# Patient Record
Sex: Female | Born: 1970 | Race: Black or African American | Hispanic: No | Marital: Married | Smoking: Never smoker
Health system: Southern US, Community
[De-identification: ages and names within clinical notes are randomized; demographics above are authoritative.]

## PROBLEM LIST (undated history)

## (undated) DIAGNOSIS — Z0000 Encounter for general adult medical examination without abnormal findings: Secondary | ICD-10-CM

## (undated) HISTORY — DX: Encounter for general adult medical examination without abnormal findings: Z00.00

## (undated) HISTORY — PX: TOOTH EXTRACTION: SUR596

---

## 2017-02-16 ENCOUNTER — Ambulatory Visit (INDEPENDENT_AMBULATORY_CARE_PROVIDER_SITE_OTHER): Payer: BC Managed Care – PPO | Admitting: Osteopathic Medicine

## 2017-02-16 ENCOUNTER — Encounter: Payer: Self-pay | Admitting: Osteopathic Medicine

## 2017-02-16 VITALS — BP 111/78 | HR 82 | Ht 63.0 in | Wt 173.0 lb

## 2017-02-16 DIAGNOSIS — Z0000 Encounter for general adult medical examination without abnormal findings: Secondary | ICD-10-CM | POA: Diagnosis not present

## 2017-02-16 NOTE — Progress Notes (Signed)
HPI: Stephanie Bennett is a 46 y.o. female  who presents to [facility] today, 02/16/17,  for chief complaint of:  Chief Complaint  Patient presents with  . Establish Care    Please see below for review of preventive care.  Notes recent illness with red eye but this has resolved, no other complaints today.   Past medical, surgical, social and family history reviewed: There are no active problems to display for this patient.  History reviewed. No pertinent surgical history.   Social History  Substance Use Topics  . Smoking status: Never Smoker  . Smokeless tobacco: Never Used  . Alcohol use Not on file   History reviewed. No pertinent family history.   Current medication list and allergy/intolerance information reviewed:   Current Outpatient Prescriptions  Medication Sig Dispense Refill  . Multiple Vitamin (MULTIVITAMIN) capsule Take by mouth.    . vitamin C (ASCORBIC ACID) 500 MG tablet Take by mouth.     No current facility-administered medications for this visit.    No Known Allergies    Review of Systems:  Constitutional:  No  fever, no chills, No recent illness, No unintentional weight changes. No significant fatigue.   HEENT: No  headache, no vision change, no hearing change, No sore throat, No  sinus pressure  Cardiac: No  chest pain, No  pressure, No palpitations, No  Orthopnea  Respiratory:  No  shortness of breath. No  Cough  Gastrointestinal: No  abdominal pain, No  nausea, No  vomiting,  No  blood in stool, No  diarrhea, No  constipation   Musculoskeletal: No new myalgia/arthralgia  Genitourinary: No  incontinence, No  abnormal genital bleeding, No abnormal genital discharge  Skin: No  Rash, No other wounds/concerning lesions  Hem/Onc: No  easy bruising/bleeding, No  abnormal lymph node  Endocrine: No cold intolerance,  No heat intolerance. No polyuria/polydipsia/polyphagia   Neurologic: No  weakness, No  dizziness,  No  slurred speech/focal weakness/facial droop  Psychiatric: No  concerns with depression, No  concerns with anxiety, No sleep problems, No mood problems  Exam:  BP 111/78   Pulse 82   Ht  (1.6 m)   Wt 173 lb (78.5 kg)   BMI 30.65 kg/m   Constitutional: VS see above. General Appearance: alert, well-developed, well-nourished, NAD  Eyes: Normal lids and conjunctive, non-icteric sclera  Ears, Nose, Mouth, Throat: MMM, Normal external inspection ears/nares/mouth/lips/gums. TM normal bilaterally. Pharynx/tonsils no erythema, no exudate. Nasal mucosa normal.   Neck: No masses, trachea midline. No thyroid enlargement. No tenderness/mass appreciated. No lymphadenopathy  Respiratory: Normal respiratory effort. no wheeze, no rhonchi, no rales  Cardiovascular: S1/S2 normal, no murmur, no rub/gallop auscultated. RRR. No lower extremity edema.   Gastrointestinal: Nontender, no masses. No hepatomegaly, no splenomegaly. No hernia appreciated. Bowel sounds normal. Rectal exam deferred.   Musculoskeletal: Gait normal. No clubbing/cyanosis of digits.   Neurological: Normal balance/coordination. No tremor  Skin: warm, dry, intact. No rash/ulcer. No concerning nevi or subq nodules on limited exam.    Psychiatric: Normal judgment/insight. Normal mood and affect. Oriented x3.     ASSESSMENT/PLAN:   Annual physical exam - Plan: CBC with Differential/Platelet, COMPLETE METABOLIC PANEL WITH GFR, Lipid panel, TSH, VITAMIN D 25 Hydroxy (Vit-D Deficiency, Fractures), HIV antibody, MM DIGITAL SCREENING BILATERAL     FEMALE PREVENTIVE CARE Updated 02/16/17   ANNUAL SCREENING/COUNSELING  Diet/Exercise - HEALTHY HABITS DISCUSSED TO DECREASE CV RISK History  Smoking Status  . Never Smoker  Smokeless Tobacco  . Never Used   History  Alcohol use Not on file   Depression screen [facility] 2/9 02/19/2017  Decreased Interest 0  Down, Depressed, Hopeless 0  PHQ - 2 Score 0    Domestic violence  concerns - no  HTN SCREENING - SEE VITALS  SEXUAL HEALTH  Sexually active in the past year - Yes with female.  Need/want STI testing today? - no  Concerns about libido or pain with sex? - no  INFECTIOUS DISEASE SCREENING  HIV - needs  GC/CT - does not need  HepC - DOB [birth year] - does not need  TB - does not need  DISEASE SCREENING  Lipid - needs  DM2 - needs  Osteoporosis - women age 32+ - does not need  CANCER SCREENING  Cervical - does not need - per patietn was done recently, ew do not have records   Breast - does not need  Lung - does not need  Colon - does not need  ADULT VACCINATION  Influenza - annual vaccine recommended - she shets these   Td - booster every 10 years - need to confirm last vaccination   Zoster - option at 50, yes at 60+   PCV13 - was not indicated  PPSV23 - was not indicated  There is no immunization history on file for this patient.   OTHER  Fall - exercise and Vit D age 32+ - does not need  Consider ASA - age 72-59 - does not need    Visit summary with medication list and pertinent instructions was printed for patient to review. All questions at time of visit were answered - patient instructed to contact office with any additional concerns. ER/RTC precautions were reviewed with the patient. Follow-up plan: Return in about 1 year (around 02/16/2018) for [facility] PHYSICAL as long as labs normal and not due for Pap, sooner if needed.

## 2017-02-18 LAB — LIPID PANEL
CHOL/HDL RATIO: 4.5 ratio (ref <5.0)
CHOLESTEROL: 159 mg/dL (ref <200)
HDL: 35 mg/dL — ABNORMAL LOW (ref >50)
LDL CALC: 98 mg/dL (ref <100)
Triglycerides: 131 mg/dL (ref <150)
VLDL: 26 mg/dL (ref <30)

## 2017-02-18 LAB — CBC WITH DIFFERENTIAL/PLATELET
BASOS ABS: 39 {cells}/uL (ref 0–200)
Basophils Relative: 1 %
EOS ABS: 234 {cells}/uL (ref 15–500)
Eosinophils Relative: 6 %
HEMATOCRIT: 41.6 % (ref 35.0–45.0)
Hemoglobin: 14.2 g/dL (ref 11.7–15.5)
LYMPHS PCT: 39 %
Lymphs Abs: 1521 cells/uL (ref 850–3900)
MCH: 30.4 pg (ref 27.0–33.0)
MCHC: 34.1 g/dL (ref 32.0–36.0)
MCV: 89.1 fL (ref 80.0–100.0)
MONO ABS: 195 {cells}/uL — AB (ref 200–950)
MPV: 11 fL (ref 7.5–12.5)
Monocytes Relative: 5 %
NEUTROS PCT: 49 %
Neutro Abs: 1911 cells/uL (ref 1500–7800)
Platelets: 252 10*3/uL (ref 140–400)
RBC: 4.67 MIL/uL (ref 3.80–5.10)
RDW: 13.7 % (ref 11.0–15.0)
WBC: 3.9 10*3/uL (ref 3.8–10.8)

## 2017-02-18 LAB — COMPLETE METABOLIC PANEL WITH GFR
AG RATIO: 1.4 ratio (ref 1.0–2.5)
ALT: 16 U/L (ref 6–29)
AST: 20 U/L (ref 10–35)
Albumin: 4.2 g/dL (ref 3.6–5.1)
Alkaline Phosphatase: 56 U/L (ref 33–115)
BUN / CREAT RATIO: 7.9 ratio (ref 6–22)
BUN: 8 mg/dL (ref 7–25)
CHLORIDE: 106 mmol/L (ref 98–110)
CO2: 21 mmol/L (ref 20–31)
Calcium: 9.1 mg/dL (ref 8.6–10.2)
Creat: 1.01 mg/dL (ref 0.50–1.10)
GFR, Est African American: 77 mL/min (ref >=60)
GFR, Est Non African American: 66 mL/min (ref >=60)
GLOBULIN: 3 g/dL (ref 1.9–3.7)
Glucose, Bld: 109 mg/dL — ABNORMAL HIGH (ref 65–99)
POTASSIUM: 4.7 mmol/L (ref 3.5–5.3)
Sodium: 144 mmol/L (ref 135–146)
Total Bilirubin: 0.7 mg/dL (ref 0.2–1.2)
Total Protein: 7.2 g/dL (ref 6.1–8.1)

## 2017-02-19 LAB — VITAMIN D 25 HYDROXY (VIT D DEFICIENCY, FRACTURES): VIT D 25 HYDROXY: 33 ng/mL (ref 30–100)

## 2017-02-19 LAB — TSH: TSH: 1.12 mIU/L

## 2017-02-19 LAB — HIV ANTIBODY (ROUTINE TESTING W REFLEX): HIV 1&2 Ab, 4th Generation: NONREACTIVE

## 2017-03-04 ENCOUNTER — Telehealth: Payer: Self-pay | Admitting: *Deleted

## 2017-03-04 DIAGNOSIS — R7301 Impaired fasting glucose: Secondary | ICD-10-CM

## 2017-03-04 NOTE — Telephone Encounter (Signed)
A1c ordered.

## 2017-03-05 LAB — HEMOGLOBIN A1C
Hgb A1c MFr Bld: 5.5 % (ref <5.7)
Mean Plasma Glucose: 111 mg/dL

## 2017-03-29 ENCOUNTER — Ambulatory Visit (INDEPENDENT_AMBULATORY_CARE_PROVIDER_SITE_OTHER): Payer: BC Managed Care – PPO

## 2017-03-29 DIAGNOSIS — Z1231 Encounter for screening mammogram for malignant neoplasm of breast: Secondary | ICD-10-CM | POA: Diagnosis not present

## 2017-08-31 ENCOUNTER — Ambulatory Visit (INDEPENDENT_AMBULATORY_CARE_PROVIDER_SITE_OTHER): Payer: 59 | Admitting: Osteopathic Medicine

## 2017-08-31 ENCOUNTER — Encounter: Payer: Self-pay | Admitting: Osteopathic Medicine

## 2017-08-31 VITALS — BP 113/68 | HR 86 | Temp 98.7°F | Resp 16 | Wt 170.3 lb

## 2017-08-31 DIAGNOSIS — J029 Acute pharyngitis, unspecified: Secondary | ICD-10-CM | POA: Diagnosis not present

## 2017-08-31 LAB — POCT RAPID STREP A (OFFICE): RAPID STREP A SCREEN: NEGATIVE

## 2017-08-31 MED ORDER — IPRATROPIUM BROMIDE 0.06 % NA SOLN: 2.0000 | Freq: Four times a day (QID) | NASAL | 1 refills | Status: DC | PRN

## 2017-08-31 MED ORDER — AMOXICILLIN 500 MG PO TABS: 500.0000 mg | ORAL_TABLET | Freq: Two times a day (BID) | ORAL | 0 refills | Status: DC

## 2017-08-31 NOTE — Patient Instructions (Addendum)
Plan: See list of over-the-counter medications and other home remedies as below for viral infection  I suspect viral upper respiratory infection since your strep test in the office was negative, but if you get a phone call that the swab for strep was positive, OR if you develop worse sore throat or fever, please fill the printed prescription for antibiotics that you were given.     Over-the-Counter Medications & Home Remedies for Upper Respiratory Illness  Note: the following list assumes no pregnancy, normal liver & kidney function and no other drug interactions. Dr. Lyn HollingsheadAlexander has highlighted medications which are safe for you to use, but these may not be appropriate for everyone. Always ask a pharmacist or qualified medical provider if you have any questions!   Aches/Pains, Fever, Headache Acetaminophen (Tylenol) 500 mg tablets - take max 2 tablets (1000 mg) every 6 hours (4 times per day)  Ibuprofen (Motrin) 200 mg tablets - take max 4 tablets (800 mg) every 6 hours*  Sinus Congestion Nasal Saline if desired to rinse Oxymetolazone (Afrin, others) sparing use due to rebound congestion, NEVER use in kids Phenylephrine (Sudafed) 10 mg tablets every 4 hours (or the 12-hour formulation)* Diphenhydramine (Benadryl) 25 mg tablets - take max 2 tablets every 4 hours  Cough & Sore Throat Dextromethorphan (Robitussin, others) - cough suppressant Guaifenesin (Robitussin, Mucinex, others) - expectorant (helps cough up mucus) (Dextromethorphan and Guaifenesin also come in a combination tablet) Lozenges w/ Benzocaine + Menthol (Cepacol) Honey - as much as you want! Teas which "coat the throat" - look for ingredients Elm Bark, Licorice Root, Marshmallow Root  Other Antibiotics if these are prescribed - take ALL, even if you're feeling better  Zinc Lozenges within 24 hours of symptoms onset - mixed evidence this shortens the duration of the common cold Don't waste your money on Vitamin C or  Echinacea  *Caution in patients with high blood pressure

## 2017-08-31 NOTE — Progress Notes (Signed)
HPI: Stephanie Bennett is a 46 y.o. female with PMH  has no past medical history on file.  who [facility] today, 08/31/17,  for chief complaint of:  Chief Complaint  Patient presents with  . Sore Throat    cough - yellowish;ear pain - B; achy x 3 dys    Sore throat/scratchy, with sore lymph node on R side on palpation. She has photo of some whitish exudate on the tonsils which resolved with gargling and none present now. Cough with some yellowish productive mucus but cough is minimal. Stuffy nose and nasal drainage. Ear pressure both sides. Aching, no measured fever.      Past medical, surgical, social and family history reviewed:  There are no active problems to display for this patient.  No past surgical history on file.  Social History   Tobacco Use  . Smoking status: Never Smoker  . Smokeless tobacco: Never Used  Substance Use Topics  . Alcohol use: Not on file    No family history on file.   Current medication list and allergy/intolerance information reviewed:    Current Outpatient Medications  Medication Sig Dispense Refill  . Multiple Vitamin (MULTIVITAMIN) capsule Take by mouth.    . vitamin C (ASCORBIC ACID) 500 MG tablet Take by mouth.     No current facility-administered medications for this visit.     No Known Allergies    Review of Systems:  Constitutional:  No  fever, no chills, +recent illness, No unintentional weight changes. +significant fatigue.   HEENT: No  headache, no vision change, no hearing change, +sore throat, N+o  sinus pressure  Cardiac: No  chest pain, No  pressure, No palpitations,  Respiratory:  No  shortness of breath. +mild Cough  Gastrointestinal: No  abdominal pain, No  nausea, No  vomiting,  No  blood in stool, No  diarrhea,  Musculoskeletal: +generalized aching without localized arthralgia  Skin: No  Rash   Exam:  BP 113/68 (BP Location: Right Arm, Patient Position:  Sitting, Cuff Size: Large)   Pulse 86   Temp 98.7 F (37.1 C) (Oral)   Resp 16   Wt 170 lb 4.8 oz (77.2 kg)   LMP 01/29/2017 (Approximate)   SpO2 98%   BMI 30.17 kg/m   Constitutional: VS see above. General Appearance: alert, well-developed, well-nourished, NAD  Eyes: Normal lids and conjunctive, non-icteric sclera  Ears, Nose, Mouth, Throat: MMM, Normal external inspection ears/nares/mouth/lips/gums. TM normal bilaterally with mild clear effusion bilaterally. Pharynx/tonsils +posterior erythema, no tonsillar or pharyngeal exudate. Nasal mucosa normal.   Neck: No masses, trachea midline. No thyroid enlargement. No tenderness/mass appreciated. No enlarged LN but pt notes some tenderness on R side  Respiratory: Normal respiratory effort. no wheeze, no rhonchi, no rales  Cardiovascular: S1/S2 normal, no murmur, no rub/gallop auscultated. RRR.   Musculoskeletal: Gait normal.  Neurological: Normal balance/coordination. No tremor.  Skin: warm, dry, intact.     ASSESSMENT/PLAN: The primary encounter diagnosis was Sorethroat. A diagnosis of Viral pharyngitis was also pertinent to this visit.   MODIFIED CENTOR CRITERIA (ponts if "yes"): Tonsillar erythema/exudate (1): Yes erythema Tender Ant Cervical LN (1): yes Absence of cough (1): Not really?  Fever (1): no Age >/= 45 (-1): yes SCORE:  1-2 TREATMENT: 2 - 3 = TEST, TX (+)SWAB, CX (-)SWAB   Results for orders placed or performed in visit on 08/31/17 (from the past 24 hour(s))  POCT rapid strep A     Status:  Normal   Collection Time: 08/31/17 10:58 AM  Result Value Ref Range   Rapid Strep A Screen Negative Negative    Patient Instructions  Plan: See list of over-the-counter medications and other home remedies as below for viral infection  I suspect viral upper respiratory infection since your strep test in the office was negative, but if you get a phone call that the swab for strep was positive, OR if you develop worse  sore throat or fever, please fill the printed prescription for antibiotics that you were given.     Over-the-Counter Medications & Home Remedies for Upper Respiratory Illness  Note: the following list assumes no pregnancy, normal liver & kidney function and no other drug interactions. Dr. Lyn HollingsheadAlexander has highlighted medications which are safe for you to use, but these may not be appropriate for everyone. Always ask a pharmacist or qualified medical provider if you have any questions!   Aches/Pains, Fever, Headache Acetaminophen (Tylenol) 500 mg tablets - take max 2 tablets (1000 mg) every 6 hours (4 times per day)  Ibuprofen (Motrin) 200 mg tablets - take max 4 tablets (800 mg) every 6 hours*  Sinus Congestion Nasal Saline if desired to rinse Oxymetolazone (Afrin, others) sparing use due to rebound congestion, NEVER use in kids Phenylephrine (Sudafed) 10 mg tablets every 4 hours (or the 12-hour formulation)* Diphenhydramine (Benadryl) 25 mg tablets - take max 2 tablets every 4 hours  Cough & Sore Throat Dextromethorphan (Robitussin, others) - cough suppressant Guaifenesin (Robitussin, Mucinex, others) - expectorant (helps cough up mucus) (Dextromethorphan and Guaifenesin also come in a combination tablet) Lozenges w/ Benzocaine + Menthol (Cepacol) Honey - as much as you want! Teas which "coat the throat" - look for ingredients Elm Bark, Licorice Root, Marshmallow Root  Other Antibiotics if these are prescribed - take ALL, even if you're feeling better  Zinc Lozenges within 24 hours of symptoms onset - mixed evidence this shortens the duration of the common cold Don't waste your money on Vitamin C or Echinacea  *Caution in patients with high blood pressure       Visit summary with medication list and pertinent instructions was printed for patient to review. All questions at time of visit were answered - patient instructed to contact office with any additional concerns. ER/RTC  precautions were reviewed with the patient. Follow-up plan: Return if symptoms worsen or fail to improve.   Please note: voice recognition software was used to produce this document, and typos may escape review. Please contact me for any needed clarifications.

## 2017-09-01 LAB — TIQ- AMBIGUOUS ORDER

## 2017-09-10 LAB — CULTURE, GROUP A STREP
MICRO NUMBER:: 81264272
SPECIMEN QUALITY:: ADEQUATE

## 2018-04-27 IMAGING — MG DIGITAL SCREENING BILATERAL MAMMOGRAM WITH CAD
4 series · 4 of 4 positions shown · non-contrast
Comparison: None.

CLINICAL DATA: Screening.

EXAM:
DIGITAL SCREENING BILATERAL MAMMOGRAM WITH CAD

[L CC]
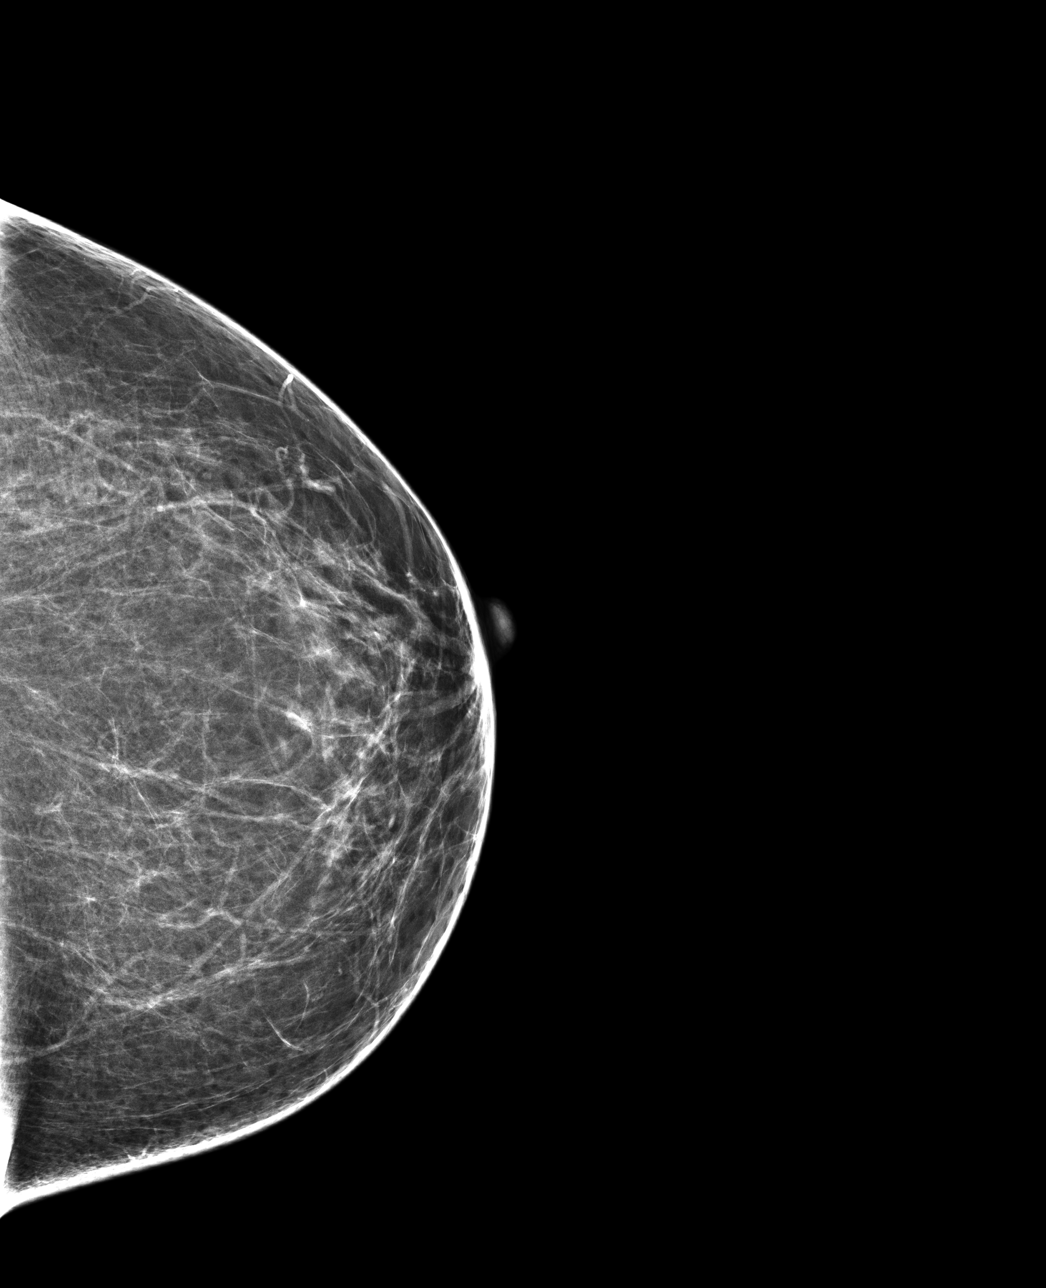

[R CC]
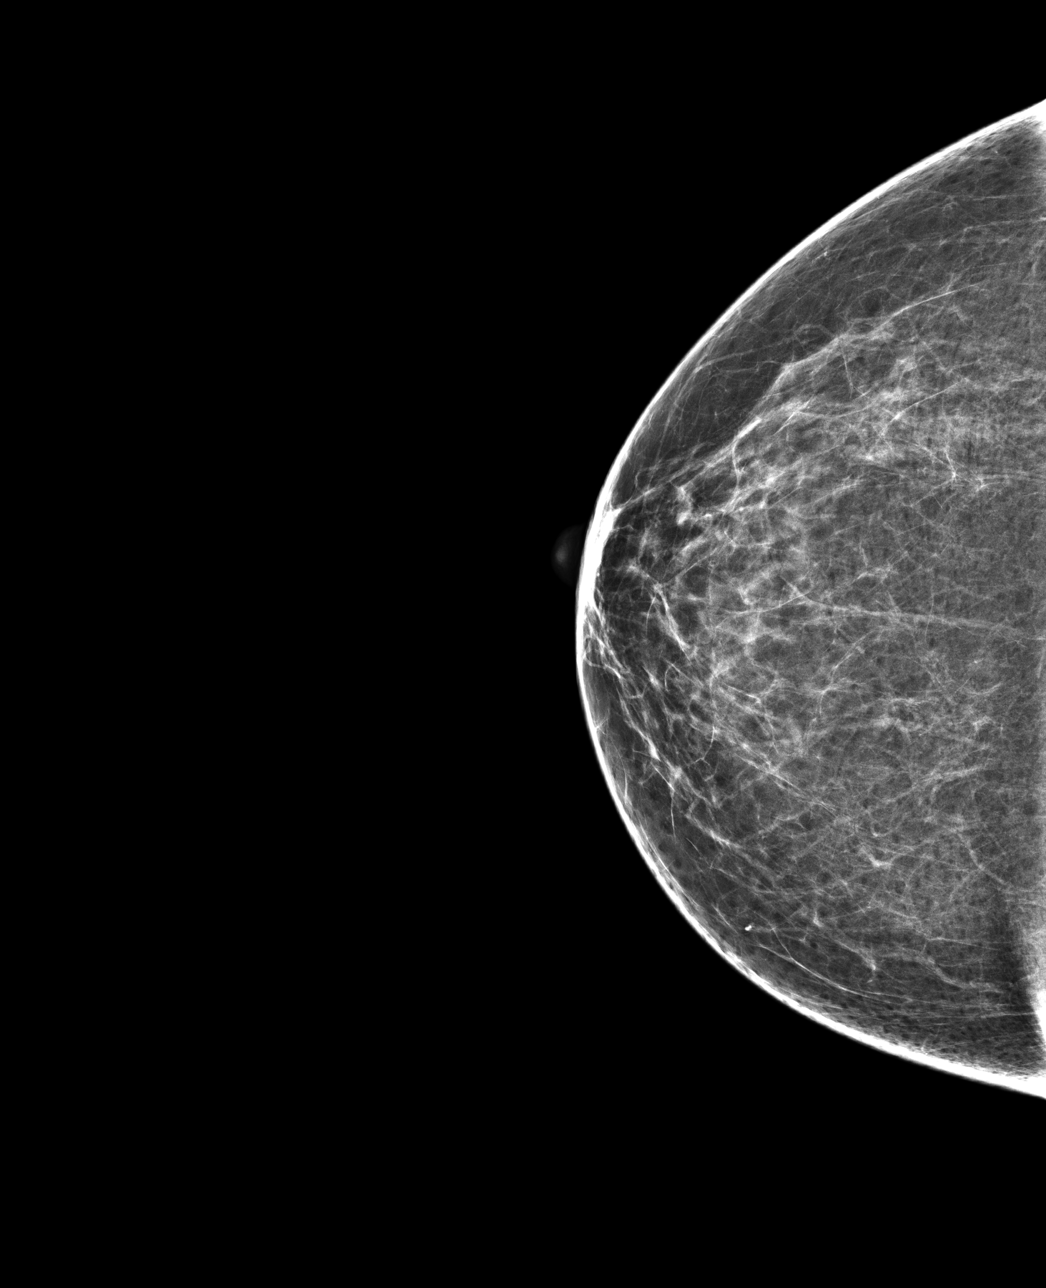

[L MLO]
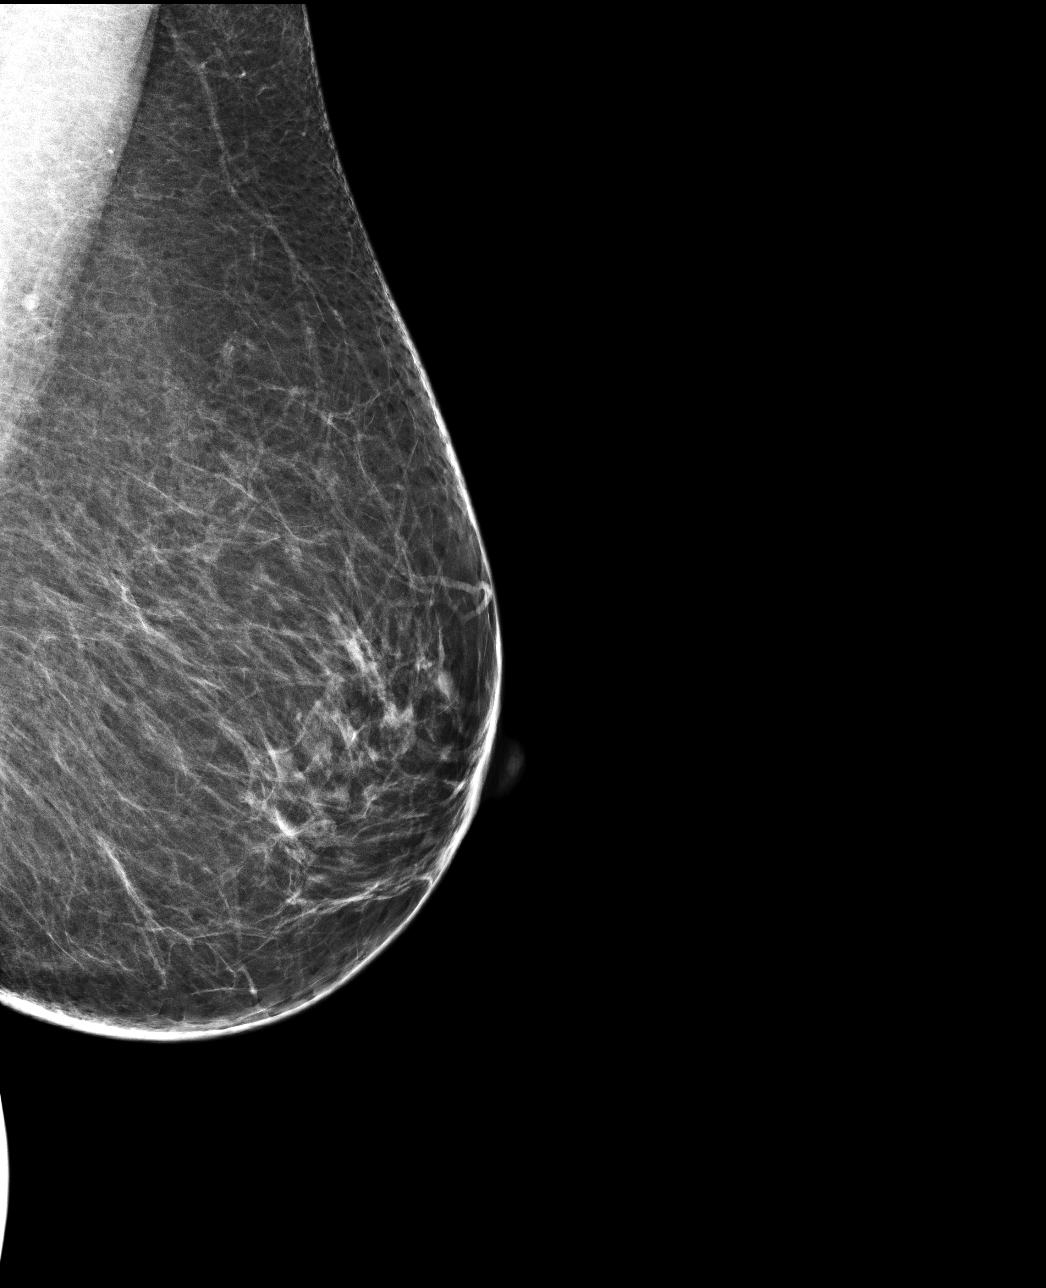

[R MLO]
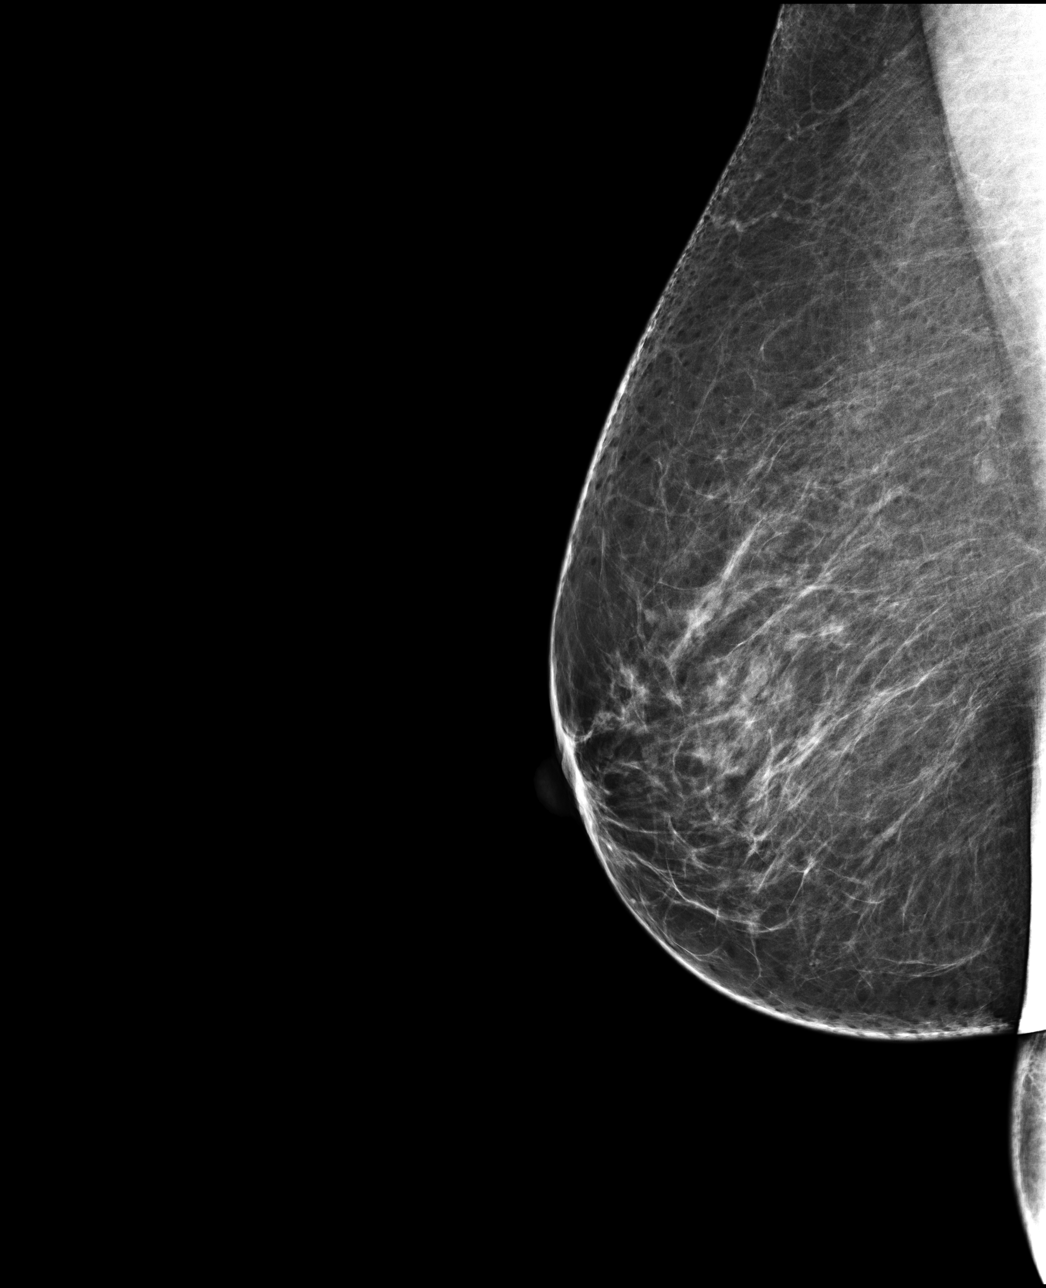

[4 of 4 positions shown; findings below may reference images not displayed]

ACR Breast Density Category b: There are scattered areas of
fibroglandular density.
FINDINGS: There are no findings suspicious for malignancy. Images were
processed with CAD.
IMPRESSION: No mammographic evidence of malignancy. A result letter of this
screening mammogram will be mailed directly to the patient.

RECOMMENDATION:
Screening mammogram in one year. (Code:[id number])

BI-RADS CATEGORY  1: Negative.

## 2018-10-09 ENCOUNTER — Other Ambulatory Visit (HOSPITAL_COMMUNITY)
Admission: RE | Admit: 2018-10-09 | Discharge: 2018-10-09 | Disposition: A | Discharge status: Home / Self Care | Payer: 59 | Source: Ambulatory Visit | Attending: Osteopathic Medicine | Admitting: Osteopathic Medicine

## 2018-10-09 ENCOUNTER — Encounter: Payer: Self-pay | Admitting: Osteopathic Medicine

## 2018-10-09 ENCOUNTER — Ambulatory Visit (INDEPENDENT_AMBULATORY_CARE_PROVIDER_SITE_OTHER): Payer: 59 | Admitting: Osteopathic Medicine

## 2018-10-09 VITALS — BP 103/56 | HR 75 | Temp 98.3°F | Wt 161.6 lb

## 2018-10-09 DIAGNOSIS — Z0000 Encounter for general adult medical examination without abnormal findings: Secondary | ICD-10-CM | POA: Diagnosis not present

## 2018-10-09 DIAGNOSIS — Z1239 Encounter for other screening for malignant neoplasm of breast: Secondary | ICD-10-CM

## 2018-10-09 DIAGNOSIS — Z124 Encounter for screening for malignant neoplasm of cervix: Secondary | ICD-10-CM | POA: Insufficient documentation

## 2018-10-09 NOTE — Progress Notes (Signed)
HPI: Stephanie Bennett is a 47 y.o. female who  has no past medical history on file.  she presents to [facility] today, 10/09/18,  for chief complaint of: Annual and Pap    Patient here for annual physical / wellness exam.  See preventive care reviewed as below.    Additional concerns today include:  None     Past medical, surgical, social and family history reviewed:  There are no active problems to display for this patient.  No past surgical history on file.  Social History   Tobacco Use  . Smoking status: Never Smoker  . Smokeless tobacco: Never Used  Substance Use Topics  . Alcohol use: Not on file    No family history on file.     Current medication list and allergy/intolerance information reviewed:    Current Outpatient Medications  Medication Sig Dispense Refill  . vitamin C (ASCORBIC ACID) 500 MG tablet Take by mouth.    Marland Kitchen amoxicillin (AMOXIL) 500 MG tablet Take 1 tablet (500 mg total) 2 (two) times daily by mouth. For 10 days. Fill prescription if culture positive (you will get a phone call) or if sore throat worse or if fever. Expires 09/10/17 (Patient not taking: Reported on 10/09/2018) 20 tablet 0  . Ginger, Zingiber officinalis, 250 MG CAPS Take 1 tablet daily by mouth.    Marland Kitchen ipratropium (ATROVENT) 0.06 % nasal spray Place 2 sprays 4 (four) times daily as needed into both nostrils for rhinitis. (Patient not taking: Reported on 10/09/2018) 15 mL 1  . Multiple Vitamin (MULTIVITAMIN) capsule Take by mouth.    . Multiple Vitamin (MULTIVITAMIN) capsule Take 1 capsule daily by mouth.    . vitamin C (ASCORBIC ACID) 500 MG tablet Take 1 tablet daily by mouth.     No current facility-administered medications for this visit.     No Known Allergies    Review of Systems:  Constitutional:  No  fever, no chills, No recent illness, No unintentional weight changes. No significant fatigue.   HEENT: No  headache, no vision change,  no hearing change, No sore throat, No  sinus pressure  Cardiac: No  chest pain, No  pressure, No palpitations, No  Orthopnea  Respiratory:  No  shortness of breath. No  Cough  Gastrointestinal: No  abdominal pain, No  nausea, No  vomiting,  No  blood in stool, No  diarrhea, No  constipation   Musculoskeletal: No new myalgia/arthralgia  Skin: No  Rash, No other wounds/concerning lesions  Genitourinary: No  incontinence, No  abnormal genital bleeding, No abnormal genital discharge  Hem/Onc: No  easy bruising/bleeding, No  abnormal lymph node  Endocrine: No cold intolerance,  No heat intolerance. No polyuria/polydipsia/polyphagia   Neurologic: No  weakness, No  dizziness, No  slurred speech/focal weakness/facial droop  Psychiatric: No  concerns with depression, No  concerns with anxiety, No sleep problems, No mood problems  Exam:  BP (!) 103/56 (BP Location: Left Arm, Patient Position: Sitting, Cuff Size: Normal)   Pulse 75   Temp 98.3 F (36.8 C) (Oral)   Wt 161 lb 9.6 oz (73.3 kg)   BMI 28.63 kg/m   Constitutional: VS see above. General Appearance: alert, well-developed, well-nourished, NAD  Eyes: Normal lids and conjunctive, non-icteric sclera  Ears, Nose, Mouth, Throat: MMM, Normal external inspection ears/nares/mouth/lips/gums. TM normal bilaterally. Pharynx/tonsils no erythema, no exudate. Nasal mucosa normal.   Neck: No masses, trachea midline. No thyroid enlargement. No tenderness/mass appreciated. No lymphadenopathy  Respiratory: Normal respiratory effort. no wheeze, no rhonchi, no rales  Cardiovascular: S1/S2 normal, no murmur, no rub/gallop auscultated. RRR. No lower extremity edema.   Gastrointestinal: Nontender, no masses. No hepatomegaly, no splenomegaly. No hernia appreciated. Bowel sounds normal. Rectal exam deferred.   Musculoskeletal: Gait normal. No clubbing/cyanosis of digits.   Neurological: Normal balance/coordination. No tremor. No cranial nerve  deficit on limited exam. Motor and sensation intact and symmetric. Cerebellar reflexes intact.   Skin: warm, dry, intact. No rash/ulcer. No concerning nevi or subq nodules on limited exam.    Psychiatric: Normal judgment/insight. Normal mood and affect. Oriented x3.  GYN: No lesions/ulcers to external genitalia, normal urethra, normal vaginal mucosa, physiologic discharge, cervix normal without lesions, uterus not enlarged or tender, adnexa no masses and nontender  BREAST: No rashes/skin changes, normal fibrous breast tissue, no masses or tenderness, normal nipple without discharge, normal axilla   No results found for this or any previous visit (from the past 72 hour(s)).  No results found.       ASSESSMENT/PLAN: The primary encounter diagnosis was Annual physical exam. Diagnoses of Cervical cancer screening and Breast cancer screening were also pertinent to this visit.   Orders Placed This Encounter  Procedures  . MM 3D SCREEN BREAST BILATERAL  . COMPLETE METABOLIC PANEL WITH GFR  . Lipid panel  . CBC    No orders of the defined types were placed in this encounter.  Immunization History  Administered Date(s) Administered  . Influenza-Unspecified 08/06/2017, 08/06/2018     Patient Instructions  General Preventive Care  Most recent routine screening lipids/other labs: ordered today  Everyone should have blood pressure checked once per year.   Tobacco: don't!   Alcohol: responsible moderation is ok for most adults - if you have concerns about your alcohol intake, please talk to me!   Exercise: as tolerated to reduce risk of cardiovascular disease and diabetes. Strength training will also prevent osteoporosis.   Mental health: if need for mental health care (medicines, counseling, other), or concerns about moods, please let me know!   Sexual health: if need for STD testing, or if concerns with libido/pain problems, please let me know! If you need to discuss your  birth control options, please let me know!   Advanced Directive: Living Will and/or Healthcare Power of Attorney recommended for all adults, regardless of age or health.  Vaccines  Flu vaccine: recommended for almost everyone, every fall.   Shingles vaccine: Shingrix recommended after age 47.  Pneumonia vaccines: Prevnar and Pneumovax recommended after age 69, or sooner if certain medical conditions.  Tetanus booster: Tdap recommended every 10 years. Please see if you can locate your shot records and give [place] a copy!  Cancer screenings   Colon cancer screening: recommended for everyone at age 69, but some folks need a colonoscopy sooner if risk factors   Breast cancer screening: mammogram recommended at age 45 every other year at least, and annually after age 45.   Cervical cancer screening: Pap every 1 to 5 years depending on age and other risk factors. Can usually stop at age 39 or w/ hysterectomy.   Lung cancer screening:not needed for non-smokers  Infection screenings . HIV, Gonorrhea/Chlamydia: screening as needed . Hepatitis C: recommended for anyone born 69-1967 - not needed  . TB: certain at-risk populations, or depending on work requirements and/or travel history  Other . Bone Density Test: recommended for women at age 74, sooner depending on risk factors  Visit summary with medication list and pertinent instructions was printed for patient to review. All questions at time of visit were answered - patient instructed to contact office with any additional concerns or updates. ER/RTC precautions were reviewed with the patient.      Please note: voice recognition software was used to produce this document, and typos may escape review. Please contact Dr. Lyn HollingsheadAlexander for any needed clarifications.     Follow-up plan: Return in about 1 year (around 10/10/2019) for annual check-up, sooner if needed .

## 2018-10-09 NOTE — Patient Instructions (Addendum)
General Preventive Care  Most recent routine screening lipids/other labs: ordered today  Everyone should have blood pressure checked once per year.   Tobacco: don't!   Alcohol: responsible moderation is ok for most adults - if you have concerns about your alcohol intake, please talk to me!   Exercise: as tolerated to reduce risk of cardiovascular disease and diabetes. Strength training will also prevent osteoporosis.   Mental health: if need for mental health care (medicines, counseling, other), or concerns about moods, please let me know!   Sexual health: if need for STD testing, or if concerns with libido/pain problems, please let me know! If you need to discuss your birth control options, please let me know!   Advanced Directive: Living Will and/or Healthcare Power of Attorney recommended for all adults, regardless of age or health.  Vaccines  Flu vaccine: recommended for almost everyone, every fall.   Shingles vaccine: Shingrix recommended after age 49.  Pneumonia vaccines: Prevnar and Pneumovax recommended after age 49, or sooner if certain medical conditions.  Tetanus booster: Tdap recommended every 10 years. Please see if you can locate your shot records and give us a copy!  Cancer screenings   Colon cancer screening: recommended for everyone at age 49, but some folks need a colonoscopy sooner if risk factors   Breast cancer screening: mammogram recommended at age 49 every other year at least, and annually after age 49.   Cervical cancer screening: Pap every 1 to 5 years depending on age and other risk factors. Can usually stop at age 49 or w/ hysterectomy.   Lung cancer screening:not needed for non-smokers  Infection screenings . HIV, Gonorrhea/Chlamydia: screening as needed . Hepatitis C: recommended for anyone born 341945-1967 - not needed  . TB: certain at-risk populations, or depending on work requirements and/or travel history  Other . Bone Density Test: recommended  for women at age 365, sooner depending on risk factors

## 2018-10-10 DIAGNOSIS — Z0000 Encounter for general adult medical examination without abnormal findings: Secondary | ICD-10-CM | POA: Diagnosis not present

## 2018-10-11 LAB — COMPLETE METABOLIC PANEL WITH GFR
AG Ratio: 1.5 (calc) (ref 1.0–2.5)
ALBUMIN MSPROF: 4.1 g/dL (ref 3.6–5.1)
ALKALINE PHOSPHATASE (APISO): 62 U/L (ref 33–115)
ALT: 12 U/L (ref 6–29)
AST: 18 U/L (ref 10–35)
BILIRUBIN TOTAL: 1 mg/dL (ref 0.2–1.2)
BUN: 9 mg/dL (ref 7–25)
CHLORIDE: 103 mmol/L (ref 98–110)
CO2: 30 mmol/L (ref 20–32)
CREATININE: 0.99 mg/dL (ref 0.50–1.10)
Calcium: 9.5 mg/dL (ref 8.6–10.2)
GFR, Est African American: 78 mL/min/{1.73_m2} (ref >=60)
GFR, Est Non African American: 67 mL/min/{1.73_m2} (ref >=60)
Globulin: 2.8 g/dL (calc) (ref 1.9–3.7)
Glucose, Bld: 93 mg/dL (ref 65–99)
Potassium: 4.7 mmol/L (ref 3.5–5.3)
Sodium: 140 mmol/L (ref 135–146)
Total Protein: 6.9 g/dL (ref 6.1–8.1)

## 2018-10-11 LAB — CBC
HCT: 40 % (ref 35.0–45.0)
Hemoglobin: 14.1 g/dL (ref 11.7–15.5)
MCH: 31.5 pg (ref 27.0–33.0)
MCHC: 35.3 g/dL (ref 32.0–36.0)
MCV: 89.5 fL (ref 80.0–100.0)
MPV: 12.3 fL (ref 7.5–12.5)
Platelets: 211 10*3/uL (ref 140–400)
RBC: 4.47 10*6/uL (ref 3.80–5.10)
RDW: 12.4 % (ref 11.0–15.0)
WBC: 3.4 10*3/uL — ABNORMAL LOW (ref 3.8–10.8)

## 2018-10-11 LAB — LIPID PANEL
Cholesterol: 148 mg/dL (ref <200)
HDL: 33 mg/dL — ABNORMAL LOW (ref >50)
LDL Cholesterol (Calc): 90 mg/dL (calc)
Non-HDL Cholesterol (Calc): 115 mg/dL (calc) (ref <130)
Total CHOL/HDL Ratio: 4.5 (calc) (ref <5.0)
Triglycerides: 149 mg/dL (ref <150)

## 2018-10-11 LAB — CYTOLOGY - PAP
Diagnosis: NEGATIVE
HPV (WINDOPATH): NOT DETECTED

## 2018-11-01 ENCOUNTER — Ambulatory Visit (INDEPENDENT_AMBULATORY_CARE_PROVIDER_SITE_OTHER): Payer: 59

## 2018-11-01 DIAGNOSIS — Z1239 Encounter for other screening for malignant neoplasm of breast: Secondary | ICD-10-CM | POA: Diagnosis not present

## 2018-11-01 DIAGNOSIS — Z0000 Encounter for general adult medical examination without abnormal findings: Secondary | ICD-10-CM

## 2018-11-01 DIAGNOSIS — Z1231 Encounter for screening mammogram for malignant neoplasm of breast: Secondary | ICD-10-CM | POA: Diagnosis not present

## 2019-06-15 ENCOUNTER — Other Ambulatory Visit: Payer: Self-pay

## 2019-06-15 ENCOUNTER — Encounter: Payer: Self-pay | Admitting: Osteopathic Medicine

## 2019-06-15 ENCOUNTER — Ambulatory Visit (INDEPENDENT_AMBULATORY_CARE_PROVIDER_SITE_OTHER): Payer: 59 | Admitting: Osteopathic Medicine

## 2019-06-15 VITALS — BP 96/61 | HR 84 | Temp 98.0°F | Wt 169.0 lb

## 2019-06-15 DIAGNOSIS — R1032 Left lower quadrant pain: Secondary | ICD-10-CM | POA: Diagnosis not present

## 2019-06-15 DIAGNOSIS — R1012 Left upper quadrant pain: Secondary | ICD-10-CM | POA: Diagnosis not present

## 2019-06-15 LAB — POCT URINALYSIS DIPSTICK
Bilirubin, UA: NEGATIVE
Blood, UA: NEGATIVE
Glucose, UA: NEGATIVE
Ketones, UA: NEGATIVE
Leukocytes, UA: NEGATIVE
Nitrite, UA: NEGATIVE
Protein, UA: NEGATIVE
Spec Grav, UA: 1.015 (ref 1.010–1.025)
Urobilinogen, UA: 0.2 E.U./dL
pH, UA: 6 (ref 5.0–8.0)

## 2019-06-15 NOTE — Patient Instructions (Signed)
Plan:  Labs and CT scan today  Will see if anything shows up on these tests  If nothing explains pain, will consider referral to specialist for more testing

## 2019-06-15 NOTE — Progress Notes (Signed)
HPI: Stephanie Bennett is a 48 y.o. female who  has no past medical history on file.  she presents to [facility] today, 06/15/19,  for chief complaint of:  Abdominal pain   . Context: seemed to start after getting some antibiotics fromher dentist . Location: L upper abdomen and L lower pelvic radiating a little into her lower back . Quality: dull, annoying pain  . Duration: started 05/14/19 . Timing: more or less constant but waxes and wanes  . Modifying factors: worse with lying down and turining toward that side  . Assoc signs/symptoms: NO diarrhea, nausea/vomiting, bloody stool, black stool, urinary problems, vaginal discharge.      At today's visit 06/15/19 ... PMH, PSH, FH reviewed and updated as needed.  Current medication list and allergy/intolerance hx reviewed and updated as needed. (See remainder of HPI, ROS, Phys Exam below)   No results found.  No results found for this or any previous visit (from the past 72 hour(s)).        ASSESSMENT/PLAN: The primary encounter diagnosis was Left upper quadrant pain. Diagnoses of Left lower quadrant pain and Left lower quadrant abdominal pain were also pertinent to this visit.   Orders Placed This Encounter  Procedures  . CT Abdomen Pelvis W Contrast  . CBC with Differential/Platelet  . COMPLETE METABOLIC PANEL WITH GFR  . Lipase  . POCT Urinalysis Dipstick       Patient Instructions  Plan:  Labs and CT scan today  Will see if anything shows up on these tests  If nothing explains pain, will consider referral to specialist for more testing      Follow-up plan: Return for RECHECK PENDING RESULTS / IF WORSE OR  CHANGE.                                                 ################################################# ################################################# ################################################# #################################################    No outpatient medications have been marked as taking for the 06/15/19 encounter (Appointment) with Emeterio Reeve, DO.    No Known Allergies     Review of Systems:  Constitutional: No recent illness  Cardiac: No  chest pain, No  pressure, No palpitations  Respiratory:  No  shortness of breath. No  Cough  Gastrointestinal: +abdominal pain, no change on bowel habits  Musculoskeletal: No new myalgia/arthralgia  Skin: No  Rash  Hem/Onc: No  easy bruising/bleeding, No  abnormal lumps/bumps  Neurologic: No  weakness, No  Dizziness   Exam:  BP 96/61 (BP Location: Left Arm, Patient Position: Sitting, Cuff Size: Normal)   Pulse 84   Temp 98 F (36.7 C) (Oral)   Wt 169 lb (76.7 kg)   BMI 29.94 kg/m   Constitutional: VS see above. General Appearance: alert, well-developed, well-nourished, NAD  Eyes: Normal lids and conjunctive, non-icteric sclera  Neck: No masses, trachea midline.   Respiratory: Normal respiratory effort. no wheeze, no rhonchi, no rales  Cardiovascular: S1/S2 normal, faint systolic murmur, no rub/gallop auscultated. RRR.   Musculoskeletal: Gait normal. Symmetric and independent movement of all extremities, neg Lloyd's, neg midline spinal tenderness   Abdominal: tender LLQ and LUQ, no rebound/guarding, non-distended, no appreciable organomegaly, neg Murphy's, BS WNLx4  Neurological: Normal balance/coordination. No tremor.  Skin: warm, dry, intact.   Psychiatric: Normal judgment/insight. Normal mood and affect. Oriented x3.  Visit summary with medication list and pertinent instructions was printed for patient to review, patient was  advised to alert us if any updates are needed. All questions at time of visit were answered - patient instructed to contact office with any additional concerns. ER/RTC precautions were reviewed with the patient and understanding verbalized.     Please note: voice recognition software was used to produce this document, and typos may escape review. Please contact Dr. Lyn HollingsheadAlexander for any needed clarifications.    Follow up plan: Return for RECHECK PENDING RESULTS / IF WORSE OR CHANGE.

## 2019-06-16 LAB — COMPLETE METABOLIC PANEL WITH GFR
AG Ratio: 1.5 (calc) (ref 1.0–2.5)
ALT: 21 U/L (ref 6–29)
AST: 18 U/L (ref 10–35)
Albumin: 4.3 g/dL (ref 3.6–5.1)
Alkaline phosphatase (APISO): 75 U/L (ref 31–125)
BUN: 15 mg/dL (ref 7–25)
CO2: 26 mmol/L (ref 20–32)
Calcium: 9.5 mg/dL (ref 8.6–10.2)
Chloride: 103 mmol/L (ref 98–110)
Creat: 0.96 mg/dL (ref 0.50–1.10)
GFR, Est African American: 80 mL/min/{1.73_m2} (ref >=60)
GFR, Est Non African American: 69 mL/min/{1.73_m2} (ref >=60)
Globulin: 2.8 g/dL (calc) (ref 1.9–3.7)
Glucose, Bld: 131 mg/dL (ref 65–139)
Potassium: 4.3 mmol/L (ref 3.5–5.3)
Sodium: 141 mmol/L (ref 135–146)
Total Bilirubin: 0.8 mg/dL (ref 0.2–1.2)
Total Protein: 7.1 g/dL (ref 6.1–8.1)

## 2019-06-16 LAB — CBC WITH DIFFERENTIAL/PLATELET
Absolute Monocytes: 208 cells/uL (ref 200–950)
Basophils Absolute: 28 cells/uL (ref 0–200)
Basophils Relative: 0.7 %
Eosinophils Absolute: 68 cells/uL (ref 15–500)
Eosinophils Relative: 1.7 %
HCT: 42.1 % (ref 35.0–45.0)
Hemoglobin: 14.5 g/dL (ref 11.7–15.5)
Lymphs Abs: 1600 cells/uL (ref 850–3900)
MCH: 30.5 pg (ref 27.0–33.0)
MCHC: 34.4 g/dL (ref 32.0–36.0)
MCV: 88.4 fL (ref 80.0–100.0)
MPV: 12.1 fL (ref 7.5–12.5)
Monocytes Relative: 5.2 %
Neutro Abs: 2096 cells/uL (ref 1500–7800)
Neutrophils Relative %: 52.4 %
Platelets: 227 10*3/uL (ref 140–400)
RBC: 4.76 10*6/uL (ref 3.80–5.10)
RDW: 11.9 % (ref 11.0–15.0)
Total Lymphocyte: 40 %
WBC: 4 10*3/uL (ref 3.8–10.8)

## 2019-06-16 LAB — LIPASE: Lipase: 40 U/L (ref 7–60)

## 2019-06-19 ENCOUNTER — Ambulatory Visit (INDEPENDENT_AMBULATORY_CARE_PROVIDER_SITE_OTHER): Payer: 59

## 2019-06-19 ENCOUNTER — Other Ambulatory Visit: Payer: Self-pay

## 2019-06-19 DIAGNOSIS — R1032 Left lower quadrant pain: Secondary | ICD-10-CM

## 2019-06-19 MED ORDER — IOPAMIDOL (ISOVUE-300) INJECTION 61%
100.0000 mL | Freq: Once | INTRAVENOUS | Status: AC | PRN
  Administered 2019-06-19: 100 mL via INTRAVENOUS

## 2019-06-22 ENCOUNTER — Encounter: Payer: Self-pay | Admitting: Osteopathic Medicine

## 2019-06-22 DIAGNOSIS — R1012 Left upper quadrant pain: Secondary | ICD-10-CM

## 2019-06-22 DIAGNOSIS — R1032 Left lower quadrant pain: Secondary | ICD-10-CM

## 2019-07-10 ENCOUNTER — Encounter: Payer: Self-pay | Admitting: Gastroenterology

## 2019-08-02 ENCOUNTER — Telehealth: Payer: Self-pay

## 2019-08-02 NOTE — Telephone Encounter (Signed)
Pt called stating she has not heard back for scheduling Gastro Referral. She is also requesting a referral for colonscopy. She continues to have stomach issues. Pls advise, thanks.

## 2019-08-03 ENCOUNTER — Ambulatory Visit: Payer: 59 | Admitting: Gastroenterology

## 2019-08-03 NOTE — Telephone Encounter (Signed)
Referral was placed, based on the referral notes the office tried to contact her and left a voicemail for her to call back.  Routing to [employer]

## 2019-08-03 NOTE — Telephone Encounter (Signed)
Patient states she will call to schedule if needed. She states mostly resolved.

## 2019-08-09 NOTE — Telephone Encounter (Signed)
Then newest note states the patient made an appointment canceled it and refused to reschedule. [place] Closed the referral - CF

## 2019-10-10 ENCOUNTER — Encounter: Payer: 59 | Admitting: Osteopathic Medicine

## 2019-11-28 ENCOUNTER — Other Ambulatory Visit: Payer: Self-pay

## 2019-11-28 ENCOUNTER — Ambulatory Visit (INDEPENDENT_AMBULATORY_CARE_PROVIDER_SITE_OTHER): Payer: 59 | Admitting: Osteopathic Medicine

## 2019-11-28 ENCOUNTER — Encounter: Payer: Self-pay | Admitting: Osteopathic Medicine

## 2019-11-28 VITALS — BP 97/62 | HR 65 | Temp 98.2°F | Wt 170.1 lb

## 2019-11-28 DIAGNOSIS — Z1231 Encounter for screening mammogram for malignant neoplasm of breast: Secondary | ICD-10-CM

## 2019-11-28 DIAGNOSIS — Z1211 Encounter for screening for malignant neoplasm of colon: Secondary | ICD-10-CM

## 2019-11-28 DIAGNOSIS — Z0000 Encounter for general adult medical examination without abnormal findings: Secondary | ICD-10-CM

## 2019-11-28 DIAGNOSIS — Z23 Encounter for immunization: Secondary | ICD-10-CM | POA: Diagnosis not present

## 2019-11-28 LAB — LIPID PANEL
Cholesterol: 141 mg/dL (ref <200)
HDL: 32 mg/dL — ABNORMAL LOW (ref >=50)
LDL Cholesterol (Calc): 80 mg/dL (calc)
Non-HDL Cholesterol (Calc): 109 mg/dL (calc) (ref <130)
Total CHOL/HDL Ratio: 4.4 (calc) (ref <5.0)
Triglycerides: 194 mg/dL — ABNORMAL HIGH (ref <150)

## 2019-11-28 LAB — COMPLETE METABOLIC PANEL WITH GFR
AG Ratio: 1.4 (calc) (ref 1.0–2.5)
ALT: 14 U/L (ref 6–29)
AST: 17 U/L (ref 10–35)
Albumin: 4.2 g/dL (ref 3.6–5.1)
Alkaline phosphatase (APISO): 64 U/L (ref 37–153)
BUN: 10 mg/dL (ref 7–25)
CO2: 32 mmol/L (ref 20–32)
Calcium: 9.5 mg/dL (ref 8.6–10.4)
Chloride: 104 mmol/L (ref 98–110)
Creat: 0.85 mg/dL (ref 0.50–1.05)
GFR, Est African American: 93 mL/min/{1.73_m2} (ref >=60)
GFR, Est Non African American: 80 mL/min/{1.73_m2} (ref >=60)
Globulin: 3 g/dL (calc) (ref 1.9–3.7)
Glucose, Bld: 99 mg/dL (ref 65–99)
Potassium: 4.7 mmol/L (ref 3.5–5.3)
Sodium: 140 mmol/L (ref 135–146)
Total Bilirubin: 0.9 mg/dL (ref 0.2–1.2)
Total Protein: 7.2 g/dL (ref 6.1–8.1)

## 2019-11-28 LAB — CBC
HCT: 40.1 % (ref 35.0–45.0)
Hemoglobin: 13.8 g/dL (ref 11.7–15.5)
MCH: 30.7 pg (ref 27.0–33.0)
MCHC: 34.4 g/dL (ref 32.0–36.0)
MCV: 89.1 fL (ref 80.0–100.0)
MPV: 11.6 fL (ref 7.5–12.5)
Platelets: 206 10*3/uL (ref 140–400)
RBC: 4.5 10*6/uL (ref 3.80–5.10)
RDW: 12.3 % (ref 11.0–15.0)
WBC: 3.6 10*3/uL — ABNORMAL LOW (ref 3.8–10.8)

## 2019-11-28 NOTE — Progress Notes (Signed)
HPI: Stephanie Bennett is a 49 y.o. female who  has no past medical history on file.  she presents to [facility] today, 11/28/19,  for chief complaint of: Annual Physical     Patient here for annual physical / wellness exam.  See preventive care reviewed as below.  Additional concerns today include: None    Past medical, surgical, social and family history reviewed:  There are no problems to display for this patient.   No past surgical history on file.  Social History   Tobacco Use  . Smoking status: Never Smoker  . Smokeless tobacco: Never Used  Substance Use Topics  . Alcohol use: Not on file    No family history on file.   Current medication list and allergy/intolerance information reviewed:    Current Outpatient Medications  Medication Sig Dispense Refill  . cholecalciferol (VITAMIN D3) 25 MCG (1000 UNIT) tablet Take 1,000 Units by mouth daily.    . Multiple Vitamin (MULTIVITAMIN) capsule Take by mouth.    . vitamin C (ASCORBIC ACID) 500 MG tablet Take by mouth.    . Ginger, Zingiber officinalis, 250 MG CAPS Take 1 tablet daily by mouth.    Marland Kitchen ipratropium (ATROVENT) 0.06 % nasal spray Place 2 sprays 4 (four) times daily as needed into both nostrils for rhinitis. (Patient not taking: Reported on 10/09/2018) 15 mL 1  . Multiple Vitamin (MULTIVITAMIN) capsule Take 1 capsule daily by mouth.    . vitamin C (ASCORBIC ACID) 500 MG tablet Take 1 tablet daily by mouth.     No current facility-administered medications for this visit.    No Known Allergies    Review of Systems:  Constitutional:  No  fever, no chills, No recent illness, No unintentional weight changes. No significant fatigue.   HEENT: No  headache, no vision change, no hearing change, No sore throat, No  sinus pressure  Cardiac: No  chest pain, No  pressure, No palpitations, No  Orthopnea  Respiratory:  No  shortness of breath. No  Cough  Gastrointestinal: No   abdominal pain, No  nausea  Musculoskeletal: No new myalgia/arthralgia  Skin: No  Rash, No other wounds/concerning lesions  Genitourinary: No  incontinence, No  abnormal genital bleeding, No abnormal genital discharge  Hem/Onc: No  easy bruising/bleeding, No  abnormal lymph node  Endocrine: No cold intolerance,  No heat intolerance.  Neurologic: No  weakness, No  dizziness  Psychiatric: No  concerns with depression, No  concerns with anxiety, No sleep problems, No mood problems  Exam:  BP 97/62 (BP Location: Left Arm, Patient Position: Sitting, Cuff Size: Normal)   Pulse 65   Temp 98.2 F (36.8 C) (Oral)   Wt 170 lb 1.9 oz (77.2 kg)   LMP 02/01/2017 (Exact Date) Comment: Perimenopausal   BMI 30.14 kg/m   Constitutional: VS see above. General Appearance: alert, well-developed, well-nourished, NAD  Eyes: Normal lids and conjunctive, non-icteric sclera  Neck: No masses, trachea midline. No thyroid enlargement. No tenderness/mass appreciated. No lymphadenopathy  Respiratory: Normal respiratory effort. no wheeze, no rhonchi, no rales  Cardiovascular: S1/S2 normal, no murmur, no rub/gallop auscultated. RRR. No lower extremity edema.  Gastrointestinal: Nontender, no masses. No hepatomegaly, no splenomegaly. No hernia appreciated. Bowel sounds normal. Rectal exam deferred.   Musculoskeletal: Gait normal. No clubbing/cyanosis of digits.   Neurological: Normal balance/coordination. No tremor. No cranial nerve deficit on limited exam. Motor and sensation intact and symmetric. Cerebellar reflexes intact.   Skin: warm, dry, intact.  No rash/ulcer.   Psychiatric: Normal judgment/insight. Normal mood and affect. Oriented x3.    No results found for this or any previous visit (from the past 72 hour(s)).  No results found.        ASSESSMENT/PLAN: The primary encounter diagnosis was Annual physical exam. Diagnoses of Need for shingles vaccine, Colon cancer screening, and  Encounter for screening mammogram for malignant neoplasm of breast were also pertinent to this visit.   Orders Placed This Encounter  Procedures  . MM 3D SCREEN BREAST BILATERAL  . Varicella-zoster vaccine IM (Shingrix)  . CBC  . Lipid panel  . COMPLETE METABOLIC PANEL WITH GFR  . Ambulatory referral to Gastroenterology    No orders of the defined types were placed in this encounter.   Patient Instructions  General Preventive Care  Most recent routine screening lipids/other labs: ordered today  Everyone should have blood pressure checked once per year.   Tobacco: don't!   Alcohol: responsible moderation is ok for most adults - if you have concerns about your alcohol intake, please talk to me!   Exercise: as tolerated to reduce risk of cardiovascular disease and diabetes. Strength training will also prevent osteoporosis.   Mental health: if need for mental health care (medicines, counseling, other), or concerns about moods, please let me know!   Sexual health: if need for STD testing, or if concerns with libido/pain problems, please let me know!   Advanced Directive: Living Will and/or Healthcare Power of Attorney recommended for all adults, regardless of age or health.  Vaccines  Flu vaccine: recommended for almost everyone, every fall.   Shingles vaccine: Shingrix recommended after age 17.  Pneumonia vaccines: Prevnar and Pneumovax recommended after age 67, or sooner if certain medical conditions.  Tetanus booster: Tdap recommended every 10 years. Please see if you can locate your shot records and give [place] a copy!  Cancer screenings   Colon cancer screening: referral placed for [facility] for colonoscopy   Breast cancer screening: mammogram due, recommended annually after age 44.   Cervical cancer screening: Pap due 09/2023  Lung cancer screening:not needed for non-smokers  Infection screenings  HIV, Gonorrhea/Chlamydia: screening as needed  Hepatitis C:  recommended for anyone born [birth year]-1964 - not needed   TB: certain at-risk populations, or depending on work requirements and/or travel history  Other  Bone Density Test: recommended for women at age 82, sooner depending on risk factors        Visit summary with medication list and pertinent instructions was printed for patient to review. All questions at time of visit were answered - patient instructed to contact office with any additional concerns or updates. ER/RTC precautions were reviewed with the patient.   Please note: voice recognition software was used to produce this document, and typos may escape review. Please contact Dr. Sheppard Coil for any needed clarifications.     Follow-up plan: Return in about 1 year (around 11/27/2020) for [place] (call week prior to visit for lab orders). 2-6 months from now [facility] visit Shingrix #2 .

## 2019-11-28 NOTE — Patient Instructions (Addendum)
General Preventive Care  Most recent routine screening lipids/other labs: ordered today  Everyone should have blood pressure checked once per year.   Tobacco: don't!   Alcohol: responsible moderation is ok for most adults - if you have concerns about your alcohol intake, please talk to me!   Exercise: as tolerated to reduce risk of cardiovascular disease and diabetes. Strength training will also prevent osteoporosis.   Mental health: if need for mental health care (medicines, counseling, other), or concerns about moods, please let me know!   Sexual health: if need for STD testing, or if concerns with libido/pain problems, please let me know!   Advanced Directive: Living Will and/or Healthcare Power of Attorney recommended for all adults, regardless of age or health.  Vaccines  Flu vaccine: recommended for almost everyone, every fall.   Shingles vaccine: Shingrix recommended after age 25.  Pneumonia vaccines: Prevnar and Pneumovax recommended after age 59, or sooner if certain medical conditions.  Tetanus booster: Tdap recommended every 10 years. Please see if you can locate your shot records and give [place] a copy!  Cancer screenings   Colon cancer screening: referral placed for [facility] for colonoscopy   Breast cancer screening: mammogram due, recommended annually after age 66.   Cervical cancer screening: Pap due 09/2023  Lung cancer screening:not needed for non-smokers  Infection screenings  HIV, Gonorrhea/Chlamydia: screening as needed  Hepatitis C: recommended for anyone born 33-1967 - not needed   TB: certain at-risk populations, or depending on work requirements and/or travel history  Other  Bone Density Test: recommended for women at age 58, sooner depending on risk factors

## 2019-11-29 ENCOUNTER — Ambulatory Visit (INDEPENDENT_AMBULATORY_CARE_PROVIDER_SITE_OTHER): Payer: 59

## 2019-11-29 ENCOUNTER — Encounter: Payer: Self-pay | Admitting: Gastroenterology

## 2019-11-29 DIAGNOSIS — Z0000 Encounter for general adult medical examination without abnormal findings: Secondary | ICD-10-CM | POA: Diagnosis not present

## 2019-11-29 DIAGNOSIS — Z1231 Encounter for screening mammogram for malignant neoplasm of breast: Secondary | ICD-10-CM

## 2019-12-04 ENCOUNTER — Other Ambulatory Visit: Payer: Self-pay | Admitting: Osteopathic Medicine

## 2019-12-04 DIAGNOSIS — R928 Other abnormal and inconclusive findings on diagnostic imaging of breast: Secondary | ICD-10-CM

## 2019-12-21 ENCOUNTER — Other Ambulatory Visit: Payer: Self-pay

## 2019-12-21 ENCOUNTER — Ambulatory Visit
Admission: RE | Admit: 2019-12-21 | Discharge: 2019-12-21 | Disposition: A | Discharge status: Home / Self Care | Payer: 59 | Source: Ambulatory Visit | Attending: Osteopathic Medicine | Admitting: Osteopathic Medicine

## 2019-12-21 DIAGNOSIS — N6489 Other specified disorders of breast: Secondary | ICD-10-CM | POA: Diagnosis not present

## 2019-12-21 DIAGNOSIS — R928 Other abnormal and inconclusive findings on diagnostic imaging of breast: Secondary | ICD-10-CM

## 2019-12-28 ENCOUNTER — Other Ambulatory Visit: Payer: Self-pay

## 2019-12-28 ENCOUNTER — Ambulatory Visit (AMBULATORY_SURGERY_CENTER): Payer: Self-pay | Admitting: *Deleted

## 2019-12-28 VITALS — Temp 96.6°F | Ht 63.0 in | Wt 172.0 lb

## 2019-12-28 DIAGNOSIS — Z1211 Encounter for screening for malignant neoplasm of colon: Secondary | ICD-10-CM

## 2019-12-28 DIAGNOSIS — Z01818 Encounter for other preprocedural examination: Secondary | ICD-10-CM

## 2019-12-28 MED ORDER — SUPREP BOWEL PREP KIT 17.5-3.13-1.6 GM/177ML PO SOLN: 1.0000 | Freq: Once | ORAL | 0 refills | Status: AC

## 2019-12-28 NOTE — Progress Notes (Signed)

## 2020-01-08 ENCOUNTER — Encounter: Payer: Self-pay | Admitting: Gastroenterology

## 2020-01-09 ENCOUNTER — Ambulatory Visit (INDEPENDENT_AMBULATORY_CARE_PROVIDER_SITE_OTHER): Payer: 59

## 2020-01-09 ENCOUNTER — Other Ambulatory Visit: Payer: Self-pay | Admitting: Gastroenterology

## 2020-01-09 DIAGNOSIS — Z1159 Encounter for screening for other viral diseases: Secondary | ICD-10-CM | POA: Diagnosis not present

## 2020-01-10 LAB — SARS CORONAVIRUS 2 (TAT 6-24 HRS): SARS Coronavirus 2: NEGATIVE

## 2020-01-11 ENCOUNTER — Encounter: Payer: Self-pay | Admitting: Gastroenterology

## 2020-01-11 ENCOUNTER — Other Ambulatory Visit: Payer: Self-pay

## 2020-01-11 ENCOUNTER — Ambulatory Visit (AMBULATORY_SURGERY_CENTER): Payer: 59 | Admitting: Gastroenterology

## 2020-01-11 VITALS — BP 107/63 | HR 61 | Temp 97.1°F | Resp 23 | Ht 63.0 in | Wt 172.0 lb

## 2020-01-11 DIAGNOSIS — Z1211 Encounter for screening for malignant neoplasm of colon: Secondary | ICD-10-CM

## 2020-01-11 MED ORDER — SODIUM CHLORIDE 0.9 % IV SOLN: 500.0000 mL | Freq: Once | INTRAVENOUS | Status: DC

## 2020-01-11 NOTE — Patient Instructions (Signed)
YOU HAD AN ENDOSCOPIC PROCEDURE TODAY AT THE [facility]:   Refer to the procedure report that was given to you for any specific questions about what was found during the examination.  If the procedure report does not answer your questions, please call your gastroenterologist to clarify.  If you requested that your care partner not be given the details of your procedure findings, then the procedure report has been included in a sealed envelope for you to review at your convenience later.  YOU SHOULD EXPECT: Some feelings of bloating in the abdomen. Passage of more gas than usual.  Walking can help get rid of the air that was put into your GI tract during the procedure and reduce the bloating. If you had a lower endoscopy (such as a colonoscopy or flexible sigmoidoscopy) you may notice spotting of blood in your stool or on the toilet paper. If you underwent a bowel prep for your procedure, you may not have a normal bowel movement for a few days.  Please Note:  You might notice some irritation and congestion in your nose or some drainage.  This is from the oxygen used during your procedure.  There is no need for concern and it should clear up in a day or so.  SYMPTOMS TO REPORT IMMEDIATELY:   Following lower endoscopy (colonoscopy or flexible sigmoidoscopy):  Excessive amounts of blood in the stool  Significant tenderness or worsening of abdominal pains  Swelling of the abdomen that is new, acute  Fever of 100F or higher  For urgent or emergent issues, a gastroenterologist can be reached at any hour by calling [phone]. Do not use MyChart messaging for urgent concerns.    DIET:  We do recommend a small meal at first, but then you may proceed to your regular diet.  Drink plenty of fluids but you should avoid alcoholic beverages for 24 hours.  ACTIVITY:  You should plan to take it easy for the rest of today and you should NOT DRIVE or use heavy machinery until tomorrow (because  of the sedation medicines used during the test).    FOLLOW UP: Our staff will call the number listed on your records 48-72 hours following your procedure to check on you and address any questions or concerns that you may have regarding the information given to you following your procedure. If we do not reach you, we will leave a message.  We will attempt to reach you two times.  During this call, we will ask if you have developed any symptoms of COVID 19. If you develop any symptoms (ie: fever, flu-like symptoms, shortness of breath, cough etc.) before then, please call [phone].  If you test positive for Covid 19 in the 2 weeks post procedure, please call and report this information to us.    If any biopsies were taken you will be contacted by phone or by letter within the next 1-3 weeks.  Please call us at [phone] if you have not heard about the biopsies in 3 weeks.    SIGNATURES/CONFIDENTIALITY: You and/or your care partner have signed paperwork which will be entered into your electronic medical record.  These signatures attest to the fact that that the information above on your After Visit Summary has been reviewed and is understood.  Full responsibility of the confidentiality of this discharge information lies with you and/or your care-partner. 

## 2020-01-11 NOTE — Progress Notes (Signed)
Pt's states no medical or surgical changes since previsit or office visit.  Temp- June Vitals- Karina 

## 2020-01-11 NOTE — Op Note (Signed)
[name] Patient Name: Stephanie Bennett Procedure Date: 01/11/2020 10:08 AM MRN: [id number] Endoscopist: Jackquline Denmark , MD Age: 49 Referring MD:  Date of Birth: 1971/02/24 Gender: Female Account #: [id number] Procedure:                Colonoscopy Indications:              Screening for colorectal malignant neoplasm Medicines:                Monitored Anesthesia Care Procedure:                Pre-Anesthesia Assessment:                           - Prior to the procedure, a History and Physical                            was performed, and patient medications and                            allergies were reviewed. The patient's tolerance of                            previous anesthesia was also reviewed. The risks                            and benefits of the procedure and the sedation                            options and risks were discussed with the patient.                            All questions were answered, and informed consent                            was obtained. Prior Anticoagulants: The patient has                            taken no previous anticoagulant or antiplatelet                            agents. ASA Grade Assessment: I - A normal, healthy                            patient. After reviewing the risks and benefits,                            the patient was deemed in satisfactory condition to                            undergo the procedure.                           After obtaining informed consent, the colonoscope  was passed under direct vision. Throughout the                            procedure, the patient's blood pressure, pulse, and                            oxygen saturations were monitored continuously. The                            Colonoscope [id number]) was introduced through the                            anus and advanced to the the cecum, identified by                            appendiceal orifice and  ileocecal valve. Thereafter                            2 cm of TI was intubated. The colonoscopy was                            performed without difficulty. The patient tolerated                            the procedure well. The quality of the bowel                            preparation was good. The terminal ileum, ileocecal                            valve, appendiceal orifice, and rectum were                            photographed. Scope In: 10:13:30 AM Scope Out: 10:28:52 AM Scope Withdrawal Time: 0 hours 9 minutes 33 seconds  Total Procedure Duration: 0 hours 15 minutes 22 seconds  Findings:                 The colon (entire examined portion) appeared normal.                           Non-bleeding internal hemorrhoids were found during                            retroflexion. The hemorrhoids were small.                           The terminal ileum appeared normal.                           The exam was otherwise without abnormality on                            direct and retroflexion views. Complications:            No  immediate complications. Estimated Blood Loss:     Estimated blood loss: none. Impression:               -Minimal internal hemorrhoids.                           -Otherwise normal colonoscopy to TI. Recommendation:           - Patient has a contact number available for                            emergencies. The signs and symptoms of potential                            delayed complications were discussed with the                            patient. Return to normal activities tomorrow.                            Written discharge instructions were provided to the                            patient.                           - Resume previous high-fiber diet.                           - Continue present medications.                           - Repeat colonoscopy in 10 years for screening                            purposes. Earlier, if with any new problems or  if                            there is any change in family history.                           - Return to GI clinic PRN. Stephanie Bologna, MD 01/11/2020 10:34:23 AM This report has been signed electronically.

## 2020-01-11 NOTE — Progress Notes (Signed)
pt tolerated well. VSS. awake and to recovery. Report given to RN.  

## 2020-01-15 ENCOUNTER — Telehealth: Payer: Self-pay | Admitting: *Deleted

## 2020-01-15 NOTE — Telephone Encounter (Signed)
  Follow up Call-  Call back number 01/11/2020  Post procedure Call Back phone  # [phone]  Permission to leave phone message Yes  Some recent data might be hidden     Patient questions:  Do you have a fever, pain , or abdominal swelling? No. Pain Score  0 *  Have you tolerated food without any problems? Yes.    Have you been able to return to your normal activities? Yes.    Do you have any questions about your discharge instructions: Diet   No. Medications  No. Follow up visit  No.  Do you have questions or concerns about your Care? No.  Actions: * If pain score is 4 or above: No action needed, pain <4.  1. Have you developed a fever since your procedure? no  2.   Have you had an respiratory symptoms (SOB or cough) since your procedure? no  3.   Have you tested positive for COVID 19 since your procedure no  4.   Have you had any family members/close contacts diagnosed with the COVID 19 since your procedure?  no   If yes to any of these questions please route to Laverna Peace, RN and Jennye Boroughs, Charity fundraiser.

## 2020-02-25 ENCOUNTER — Ambulatory Visit (INDEPENDENT_AMBULATORY_CARE_PROVIDER_SITE_OTHER): Payer: 59 | Admitting: Medical-Surgical

## 2020-02-25 ENCOUNTER — Other Ambulatory Visit: Payer: Self-pay

## 2020-02-25 VITALS — Temp 98.4°F

## 2020-02-25 DIAGNOSIS — Z23 Encounter for immunization: Secondary | ICD-10-CM

## 2020-02-25 NOTE — Progress Notes (Signed)
Patient presented to office for second Shingrix vaccine. Patient denies any recent fevers or side effects from first injection.   Patient advised that she cannot have any other vaccines until 2 weeks after this one. She confirms she has not had any other vaccines within the last 2 weeks.   Injection administered and tolerated well without any immediate reactions.

## 2020-11-28 ENCOUNTER — Ambulatory Visit (INDEPENDENT_AMBULATORY_CARE_PROVIDER_SITE_OTHER): Payer: 59 | Admitting: Osteopathic Medicine

## 2020-11-28 ENCOUNTER — Encounter: Payer: Self-pay | Admitting: Osteopathic Medicine

## 2020-11-28 ENCOUNTER — Other Ambulatory Visit: Payer: Self-pay

## 2020-11-28 VITALS — BP 106/73 | HR 74 | Temp 98.0°F | Wt 171.1 lb

## 2020-11-28 DIAGNOSIS — Z1231 Encounter for screening mammogram for malignant neoplasm of breast: Secondary | ICD-10-CM | POA: Diagnosis not present

## 2020-11-28 DIAGNOSIS — Z0000 Encounter for general adult medical examination without abnormal findings: Secondary | ICD-10-CM | POA: Diagnosis not present

## 2020-11-28 NOTE — Patient Instructions (Addendum)
General Preventive Care  Most recent routine screening labs: ordered today!   Blood pressure goal 130/80 or less.   Tobacco: don't!   Alcohol: responsible moderation is ok for most adults - if you have concerns about your alcohol intake, please talk to me!   Exercise: as tolerated to reduce risk of cardiovascular disease and diabetes. Strength training will also prevent osteoporosis.   Mental health: if need for mental health care (medicines, counseling, other), or concerns about moods, please let me know!   Sexual / Reproductive health: if need for STD testing, or if concerns with libido/pain problems, please let me know!  Advanced Directive: Living Will and/or Healthcare Power of Attorney recommended for all adults, regardless of age or health.  Vaccines  Flu vaccine: recommended every fall/winter  Shingles vaccine: done!  Pneumonia vaccines: after age 66.  Tetanus booster: every 10 years  COVID vaccine: THANKS for getting your vaccine! :) Cancer screenings   Colon cancer screening: for everyone age 37-75. Due 2031.   Breast cancer screening: mammogram due! Ordered.   Cervical cancer screening: Pap due 09/2023  Lung cancer screening: not needed for non-smokers  Infection screenings  . HIV: recommended screening at least once age 8-65 . Gonorrhea/Chlamydia: screening as needed . Hepatitis C: recommended once for everyone age 34-75 . TB: certain at-risk populations Other . Bone Density Test: recommended for women at age 32

## 2020-11-28 NOTE — Progress Notes (Signed)
HPI: Stephanie Bennett is a 50 y.o. female who  has a past medical history of Healthy adult on routine physical examination.  she presents to [facility] today, 11/28/20,  for chief complaint of: Annual Physical     Patient here for annual physical / wellness exam.  See preventive care reviewed as below.  Additional concerns today include: None Grandmother just passed, she was 45 years old!     Past medical, surgical, social and family history reviewed:  There are no problems to display for this patient.   Past Surgical History:  Procedure Laterality Date  . TOOTH EXTRACTION     x2    Social History   Tobacco Use  . Smoking status: Never Smoker  . Smokeless tobacco: Never Used  Substance Use Topics  . Alcohol use: Never    Family History  Problem Relation Age of Onset  . Colon cancer Neg Hx   . Colon polyps Neg Hx   . Esophageal cancer Neg Hx   . Rectal cancer Neg Hx   . Stomach cancer Neg Hx      Current medication list and allergy/intolerance information reviewed:    Current Outpatient Medications  Medication Sig Dispense Refill  . cholecalciferol (VITAMIN D3) 25 MCG (1000 UNIT) tablet Take 1,000 Units by mouth daily.    . vitamin C (ASCORBIC ACID) 500 MG tablet Take 1 tablet daily by mouth.    . Ginger, Zingiber officinalis, 250 MG CAPS Take 1 tablet daily by mouth. (Patient not taking: Reported on 11/28/2020)    . Multiple Vitamin (MULTIVITAMIN) capsule Take by mouth. (Patient not taking: Reported on 11/28/2020)    . Multiple Vitamin (MULTIVITAMIN) capsule Take 1 capsule daily by mouth. (Patient not taking: Reported on 11/28/2020)    . vitamin C (ASCORBIC ACID) 500 MG tablet Take by mouth. (Patient not taking: Reported on 11/28/2020)     No current facility-administered medications for this visit.    No Known Allergies    Review of Systems:  Constitutional:  No  fever, no chills, No recent illness  HEENT: No   headache  Cardiac: No  chest pain  Respiratory:  No  shortness of breath. No  Cough  Gastrointestinal: No  abdominal pain, No  nausea  Musculoskeletal: No new myalgia/arthralgia  Skin: No  Rash, No other wounds/concerning lesions  Genitourinary: No  incontinence, No  abnormal genital bleeding, No abnormal genital discharge  Neurologic: No  weakness, No  dizziness  Psychiatric: No  concerns with depression  Exam:  BP 106/73 (BP Location: Left Arm, Patient Position: Sitting, Cuff Size: Large)   Pulse 74   Temp 98 F (36.7 C) (Oral)   Wt 171 lb 1.9 oz (77.6 kg)   LMP 02/01/2017 (Exact Date) Comment: Perimenopausal   BMI 30.31 kg/m   Constitutional: VS see above. General Appearance: alert, well-developed, well-nourished, NAD  Eyes: Normal lids and conjunctive, non-icteric sclera  Neck: No masses, trachea midline. No thyroid enlargement. No tenderness/mass appreciated. No lymphadenopathy  Respiratory: Normal respiratory effort. no wheeze, no rhonchi, no rales  Cardiovascular: S1/S2 normal, no murmur, no rub/gallop auscultated. RRR. No lower extremity edema.  Gastrointestinal: Nontender, no masses. No hepatomegaly, no splenomegaly. No hernia appreciated. Bowel sounds normal. Rectal exam deferred.   Musculoskeletal: Gait normal. No clubbing/cyanosis of digits.   Neurological: Normal balance/coordination. No tremor. No cranial nerve deficit on limited exam. Motor and sensation intact and symmetric. Cerebellar reflexes intact.   Skin: warm, dry, intact. No rash/ulcer.  Psychiatric: Normal judgment/insight. Normal mood and affect. Oriented x3.    No results found for this or any previous visit (from the past 72 hour(s)).  No results found.        ASSESSMENT/PLAN: The primary encounter diagnosis was Annual physical exam. A diagnosis of Breast cancer screening by mammogram was also pertinent to this visit.   Orders Placed This Encounter  Procedures  . MM 3D SCREEN  BREAST BILATERAL  . CBC  . COMPLETE METABOLIC PANEL WITH GFR  . Lipid panel    No orders of the defined types were placed in this encounter.   Patient Instructions  General Preventive Care  Most recent routine screening labs: ordered today!   Blood pressure goal 130/80 or less.   Tobacco: don't!   Alcohol: responsible moderation is ok for most adults - if you have concerns about your alcohol intake, please talk to me!   Exercise: as tolerated to reduce risk of cardiovascular disease and diabetes. Strength training will also prevent osteoporosis.   Mental health: if need for mental health care (medicines, counseling, other), or concerns about moods, please let me know!   Sexual / Reproductive health: if need for STD testing, or if concerns with libido/pain problems, please let me know!  Advanced Directive: Living Will and/or Healthcare Power of Attorney recommended for all adults, regardless of age or health.  Vaccines  Flu vaccine: recommended every fall/winter  Shingles vaccine: done!  Pneumonia vaccines: after age 41.  Tetanus booster: every 10 years  COVID vaccine: THANKS for getting your vaccine! :) Cancer screenings   Colon cancer screening: for everyone age 32-75. Due 2031.   Breast cancer screening: mammogram due! Ordered.   Cervical cancer screening: Pap due 09/2023  Lung cancer screening: not needed for non-smokers  Infection screenings  . HIV: recommended screening at least once age 59-65 . Gonorrhea/Chlamydia: screening as needed . Hepatitis C: recommended once for everyone age 68-75 . TB: certain at-risk populations Other . Bone Density Test: recommended for women at age 1       Visit summary with medication list and pertinent instructions was printed for patient to review. All questions at time of visit were answered - patient instructed to contact office with any additional concerns or updates. ER/RTC precautions were reviewed with the  patient.   Please note: voice recognition software was used to produce this document, and typos may escape review. Please contact Dr. Lyn Hollingshead for any needed clarifications.     Follow-up plan: Return in about 1 year (around 11/28/2021) for ANNUAL CHECK-UP - SEE [place] SOONER IF NEEDED.

## 2020-12-01 DIAGNOSIS — Z1231 Encounter for screening mammogram for malignant neoplasm of breast: Secondary | ICD-10-CM | POA: Diagnosis not present

## 2020-12-01 DIAGNOSIS — Z0000 Encounter for general adult medical examination without abnormal findings: Secondary | ICD-10-CM | POA: Diagnosis not present

## 2020-12-02 LAB — CBC
HCT: 41.1 % (ref 35.0–45.0)
Hemoglobin: 14.4 g/dL (ref 11.7–15.5)
MCH: 31.6 pg (ref 27.0–33.0)
MCHC: 35 g/dL (ref 32.0–36.0)
MCV: 90.3 fL (ref 80.0–100.0)
MPV: 11.9 fL (ref 7.5–12.5)
Platelets: 217 10*3/uL (ref 140–400)
RBC: 4.55 10*6/uL (ref 3.80–5.10)
RDW: 11.9 % (ref 11.0–15.0)
WBC: 4.2 10*3/uL (ref 3.8–10.8)

## 2020-12-02 LAB — COMPLETE METABOLIC PANEL WITH GFR
AG Ratio: 1.5 (calc) (ref 1.0–2.5)
ALT: 14 U/L (ref 6–29)
AST: 16 U/L (ref 10–35)
Albumin: 4.3 g/dL (ref 3.6–5.1)
Alkaline phosphatase (APISO): 62 U/L (ref 37–153)
BUN: 13 mg/dL (ref 7–25)
CO2: 31 mmol/L (ref 20–32)
Calcium: 9.1 mg/dL (ref 8.6–10.4)
Chloride: 102 mmol/L (ref 98–110)
Creat: 0.91 mg/dL (ref 0.50–1.05)
GFR, Est African American: 85 mL/min/{1.73_m2} (ref >=60)
GFR, Est Non African American: 73 mL/min/{1.73_m2} (ref >=60)
Globulin: 2.9 g/dL (calc) (ref 1.9–3.7)
Glucose, Bld: 95 mg/dL (ref 65–99)
Potassium: 4.6 mmol/L (ref 3.5–5.3)
Sodium: 139 mmol/L (ref 135–146)
Total Bilirubin: 0.8 mg/dL (ref 0.2–1.2)
Total Protein: 7.2 g/dL (ref 6.1–8.1)

## 2020-12-02 LAB — LIPID PANEL
Cholesterol: 149 mg/dL (ref <200)
HDL: 37 mg/dL — ABNORMAL LOW (ref >=50)
LDL Cholesterol (Calc): 91 mg/dL (calc)
Non-HDL Cholesterol (Calc): 112 mg/dL (calc) (ref <130)
Total CHOL/HDL Ratio: 4 (calc) (ref <5.0)
Triglycerides: 109 mg/dL (ref <150)

## 2020-12-27 IMAGING — MG DIGITAL SCREENING BILAT W/ TOMO W/ CAD
3 series · 3 of 7 positions shown · non-contrast
Comparison: Previous exam(s).

CLINICAL DATA: Screening.

EXAM:
DIGITAL SCREENING BILATERAL MAMMOGRAM WITH TOMO AND CAD

[L CC synth-2D]
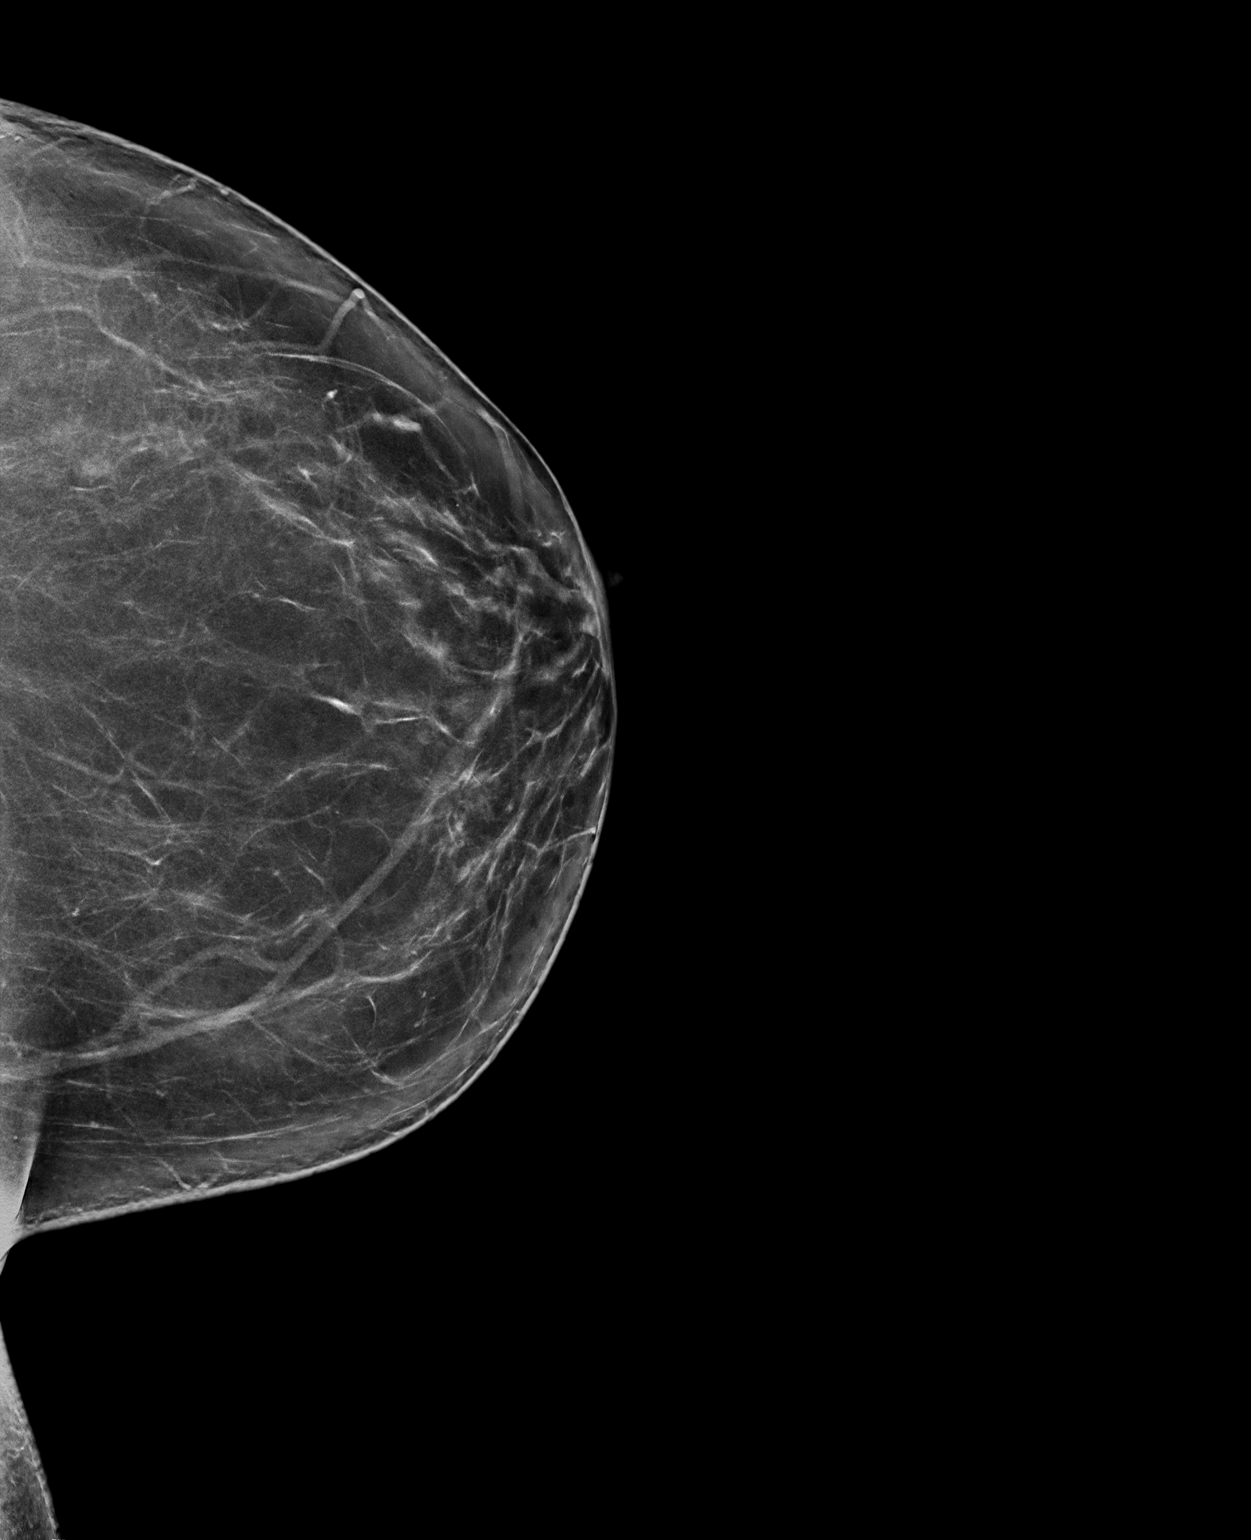

[R CC synth-2D]
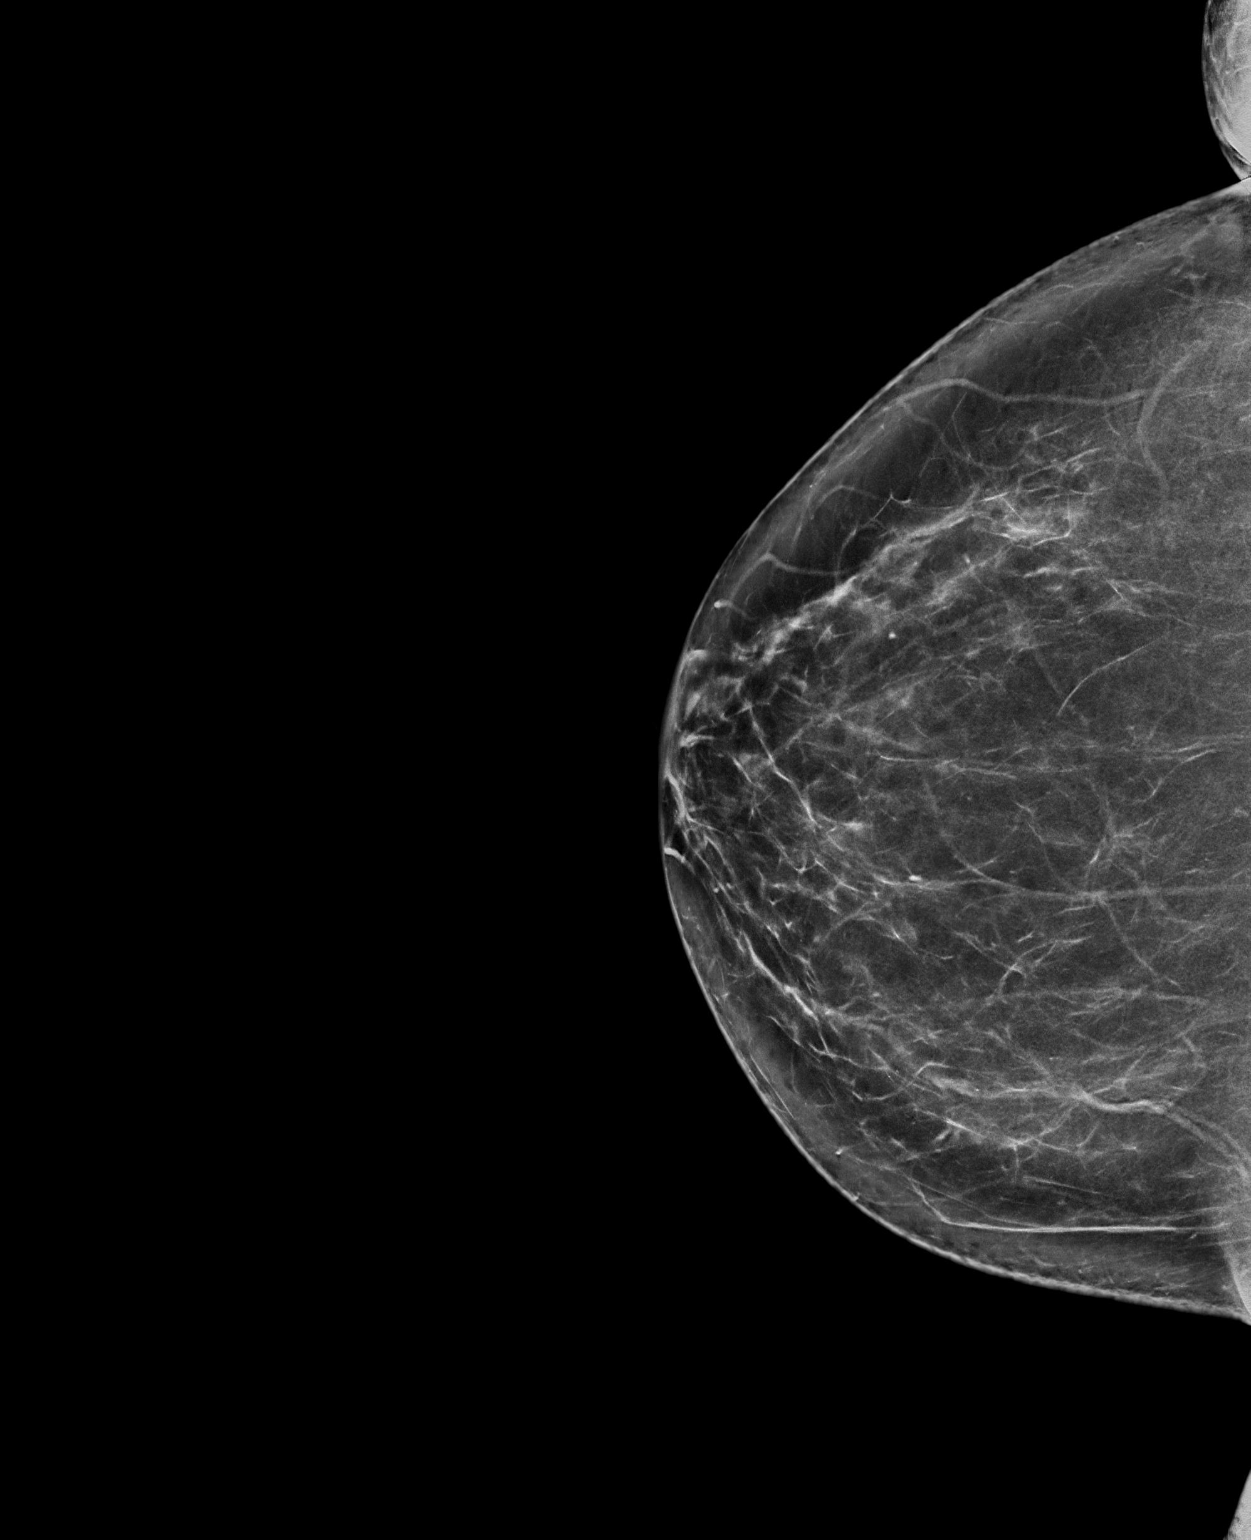

[R MLO tomo · tomo slice 45/90.0]
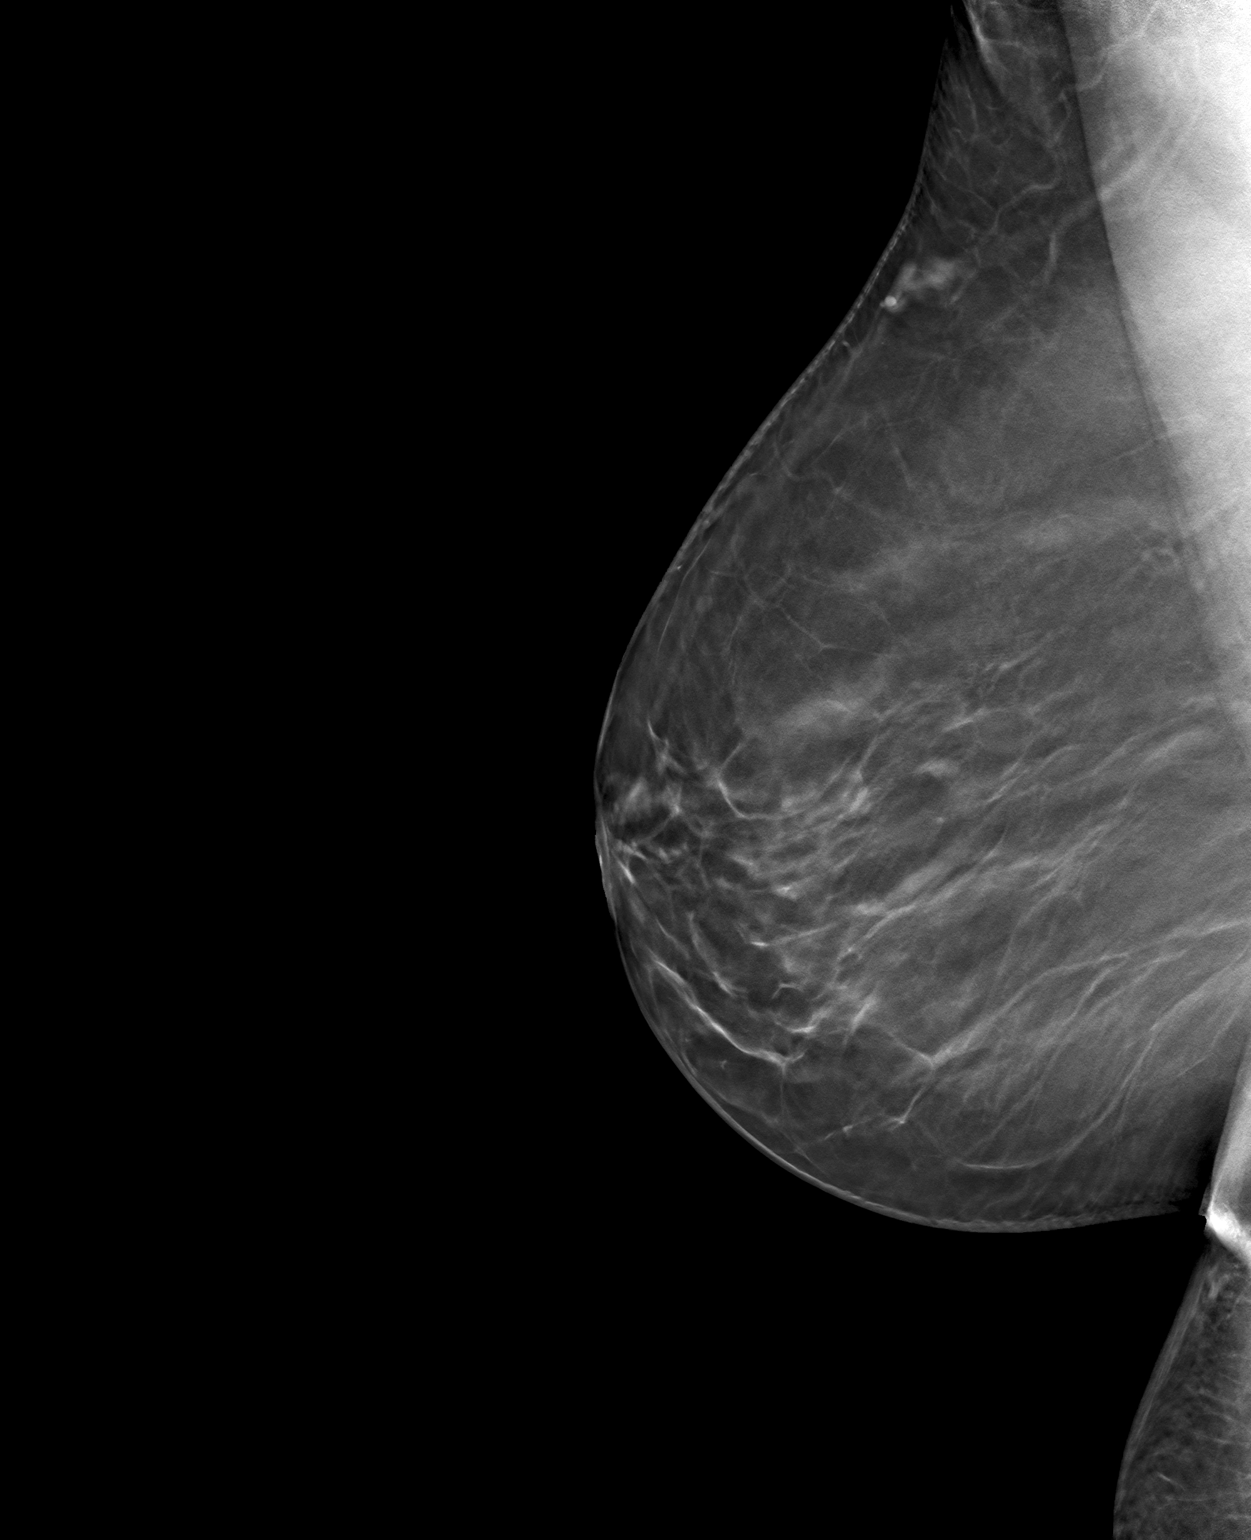

[3 of 7 positions shown; findings below may reference images not displayed]

ACR Breast Density Category b: There are scattered areas of
fibroglandular density.
FINDINGS: In the left breast, a possible asymmetry warrants further
evaluation. In the right breast, no findings suspicious for
malignancy. Images were processed with CAD.
IMPRESSION: Further evaluation is suggested for possible asymmetry in the left
breast.

RECOMMENDATION:
Diagnostic mammogram and possibly ultrasound of the left breast.
(Code:DI-5-LL4)

The patient will be contacted regarding the findings, and additional
imaging will be scheduled.

BI-RADS CATEGORY  0: Incomplete. Need additional imaging evaluation
and/or prior mammograms for comparison.

## 2021-01-01 ENCOUNTER — Other Ambulatory Visit: Payer: Self-pay

## 2021-01-01 ENCOUNTER — Ambulatory Visit (INDEPENDENT_AMBULATORY_CARE_PROVIDER_SITE_OTHER): Payer: 59

## 2021-01-01 DIAGNOSIS — Z1231 Encounter for screening mammogram for malignant neoplasm of breast: Secondary | ICD-10-CM

## 2021-04-02 ENCOUNTER — Other Ambulatory Visit: Payer: Self-pay | Admitting: Family Medicine

## 2021-04-02 ENCOUNTER — Other Ambulatory Visit: Payer: Self-pay

## 2021-04-02 ENCOUNTER — Ambulatory Visit (INDEPENDENT_AMBULATORY_CARE_PROVIDER_SITE_OTHER): Payer: 59 | Admitting: Family Medicine

## 2021-04-02 ENCOUNTER — Encounter: Payer: Self-pay | Admitting: Family Medicine

## 2021-04-02 VITALS — BP 113/71 | HR 77 | Temp 99.1°F

## 2021-04-02 DIAGNOSIS — N898 Other specified noninflammatory disorders of vagina: Secondary | ICD-10-CM | POA: Diagnosis not present

## 2021-04-02 DIAGNOSIS — R300 Dysuria: Secondary | ICD-10-CM

## 2021-04-02 DIAGNOSIS — A6004 Herpesviral vulvovaginitis: Secondary | ICD-10-CM | POA: Diagnosis not present

## 2021-04-02 LAB — POCT URINALYSIS DIP (CLINITEK)
Bilirubin, UA: NEGATIVE
Glucose, UA: NEGATIVE mg/dL
Ketones, POC UA: NEGATIVE mg/dL
Nitrite, UA: NEGATIVE
POC PROTEIN,UA: NEGATIVE
Spec Grav, UA: 1.025 (ref 1.010–1.025)
Urobilinogen, UA: 0.2 E.U./dL
pH, UA: 5.5 (ref 5.0–8.0)

## 2021-04-02 MED ORDER — ACYCLOVIR 800 MG PO TABS: 800.0000 mg | ORAL_TABLET | Freq: Three times a day (TID) | ORAL | 0 refills | Status: AC

## 2021-04-02 NOTE — Progress Notes (Signed)
Acute Office Visit  Subjective:    Patient ID: Stephanie Bennett, female    DOB: 11-18-1970, 50 y.o.   MRN: [id number]  Chief Complaint  Patient presents with   Vaginal Itching    HPI Patient is in today for vaginal irritation.  Patient in today for vaginal irritation for the past several days.  She reports burning, itching, tingling/pain in vagina with some white discharge.  Her husband looked for her and stated he did see some possible herpes flare at the tip of the vagina.  She is having some mild dysuria, worse when the urine touches the skin.  She denies any hematuria, suprapubic or abdominal pain.  Reports history of genital herpes in the past which she has been treated with acyclovir by previous provider.     Past Medical History:  Diagnosis Date   Healthy adult on routine physical examination     Past Surgical History:  Procedure Laterality Date   TOOTH EXTRACTION     x2    Family History  Problem Relation Age of Onset   Colon cancer Neg Hx    Colon polyps Neg Hx    Esophageal cancer Neg Hx    Rectal cancer Neg Hx    Stomach cancer Neg Hx     Social History   Socioeconomic History   Marital status: Married    Spouse name: Not on file   Number of children: Not on file   Years of education: Not on file   Highest education level: Not on file  Occupational History   Not on file  Tobacco Use   Smoking status: Never   Smokeless tobacco: Never  Substance and Sexual Activity   Alcohol use: Never   Drug use: Never   Sexual activity: Not on file  Other Topics Concern   Not on file  Social History Narrative   Not on file   Social Determinants of Health   Financial Resource Strain: Not on file  Food Insecurity: Not on file  Transportation Needs: Not on file  Physical Activity: Not on file  Stress: Not on file  Social Connections: Not on file  Intimate Partner Violence: Not on file    Outpatient Medications Prior to Visit  Medication Sig Dispense  Refill   cholecalciferol (VITAMIN D3) 25 MCG (1000 UNIT) tablet Take 1,000 Units by mouth daily.     Ginger, Zingiber officinalis, 250 MG CAPS Take 1 tablet daily by mouth. (Patient not taking: Reported on 11/28/2020)     Multiple Vitamin (MULTIVITAMIN) capsule Take by mouth. (Patient not taking: Reported on 11/28/2020)     Multiple Vitamin (MULTIVITAMIN) capsule Take 1 capsule daily by mouth. (Patient not taking: Reported on 11/28/2020)     vitamin C (ASCORBIC ACID) 500 MG tablet Take by mouth. (Patient not taking: Reported on 11/28/2020)     vitamin C (ASCORBIC ACID) 500 MG tablet Take 1 tablet daily by mouth.     No facility-administered medications prior to visit.    No Known Allergies  Review of Systems All review of systems negative except what is listed in the HPI     Objective:    Physical Exam Constitutional:      Appearance: Normal appearance.  Abdominal:     Tenderness: There is no right CVA tenderness or left CVA tenderness.  Genitourinary:    Vagina: Vaginal discharge present.     Comments: Mild amount of milky-white discharge in vagina; possibly a few small eruptions to vagina Skin:  General: Skin is warm and dry.  Neurological:     Mental Status: She is alert and oriented to person, place, and time.  Psychiatric:        Mood and Affect: Mood normal.        Behavior: Behavior normal.        Thought Content: Thought content normal.        Judgment: Judgment normal.    BP 113/71   Pulse 77   Temp 99.1 F (37.3 C)   LMP 02/01/2017 (Exact Date) Comment: Perimenopausal   SpO2 97%  Wt Readings from Last 3 Encounters:  11/28/20 171 lb 1.9 oz (77.6 kg)  01/11/20 172 lb (78 kg)  12/28/19 172 lb (78 kg)    Health Maintenance Due  Topic Date Due   Hepatitis C Screening  Never done   COVID-19 Vaccine (3 - Booster for Pfizer series) 11/13/2020    There are no preventive care reminders to display for this patient.   Lab Results  Component Value Date   TSH 1.12  02/16/2017   Lab Results  Component Value Date   WBC 4.2 12/01/2020   HGB 14.4 12/01/2020   HCT 41.1 12/01/2020   MCV 90.3 12/01/2020   PLT 217 12/01/2020   Lab Results  Component Value Date   NA 139 12/01/2020   K 4.6 12/01/2020   CO2 31 12/01/2020   GLUCOSE 95 12/01/2020   BUN 13 12/01/2020   CREATININE 0.91 12/01/2020   BILITOT 0.8 12/01/2020   ALKPHOS 56 02/16/2017   AST 16 12/01/2020   ALT 14 12/01/2020   PROT 7.2 12/01/2020   ALBUMIN 4.2 02/16/2017   CALCIUM 9.1 12/01/2020   Lab Results  Component Value Date   CHOL 149 12/01/2020   Lab Results  Component Value Date   HDL 37 (L) 12/01/2020   Lab Results  Component Value Date   LDLCALC 91 12/01/2020   Lab Results  Component Value Date   TRIG 109 12/01/2020   Lab Results  Component Value Date   CHOLHDL 4.0 12/01/2020   Lab Results  Component Value Date   HGBA1C 5.5 03/04/2017       Assessment & Plan:   1. Dysuria 2. Vaginal discharge UA with small leuks and trace blood. Will send for culture. Wet prep done today. Will update with results and plan.  - POCT URINALYSIS DIP (CLINITEK) - Urine Culture; Future - WET PREP FOR TRICH, YEAST, CLUE  3. Herpes simplex vulvovaginitis A few small eruptions to vagina, but patient not interested in swabbing due to cost. Reports known history of genital herpes and has had success with acyclovir in the past. Will send in.  - acyclovir (ZOVIRAX) 800 MG tablet; Take 1 tablet (800 mg total) by mouth 3 (three) times daily for 2 days.  Dispense: 6 tablet; Refill: 0   Patient aware of signs/symptoms requiring further/urgent evaluation.  Follow-up if symptoms worsen or fail to improve.     Clayborne Dana, NP

## 2021-04-03 ENCOUNTER — Encounter: Payer: Self-pay | Admitting: Family Medicine

## 2021-04-03 LAB — URINE CULTURE
MICRO NUMBER:: 11991032
SPECIMEN QUALITY:: ADEQUATE

## 2021-04-03 LAB — HOUSE ACCOUNT TRACKING

## 2021-04-03 LAB — WET PREP FOR TRICH, YEAST, CLUE
MICRO NUMBER:: 11988717
Specimen Quality: ADEQUATE

## 2021-04-07 NOTE — Progress Notes (Signed)
MyChart message sent - Stephanie Bennett, your urine culture showed some mixed flora, but this does not necessarily indicate a urinary tract infection. How are you feeling?

## 2021-11-30 ENCOUNTER — Ambulatory Visit (INDEPENDENT_AMBULATORY_CARE_PROVIDER_SITE_OTHER): Payer: 59 | Admitting: Medical-Surgical

## 2021-11-30 ENCOUNTER — Encounter: Payer: Self-pay | Admitting: Medical-Surgical

## 2021-11-30 ENCOUNTER — Other Ambulatory Visit: Payer: Self-pay

## 2021-11-30 ENCOUNTER — Encounter: Payer: 59 | Admitting: Osteopathic Medicine

## 2021-11-30 VITALS — BP 110/73 | HR 75 | Resp 20 | Ht 63.0 in | Wt 180.3 lb

## 2021-11-30 DIAGNOSIS — Z0000 Encounter for general adult medical examination without abnormal findings: Secondary | ICD-10-CM | POA: Diagnosis not present

## 2021-11-30 DIAGNOSIS — M79641 Pain in right hand: Secondary | ICD-10-CM | POA: Diagnosis not present

## 2021-11-30 DIAGNOSIS — Z1329 Encounter for screening for other suspected endocrine disorder: Secondary | ICD-10-CM | POA: Diagnosis not present

## 2021-11-30 DIAGNOSIS — Z1231 Encounter for screening mammogram for malignant neoplasm of breast: Secondary | ICD-10-CM | POA: Diagnosis not present

## 2021-11-30 DIAGNOSIS — Z7689 Persons encountering health services in other specified circumstances: Secondary | ICD-10-CM

## 2021-11-30 DIAGNOSIS — Z131 Encounter for screening for diabetes mellitus: Secondary | ICD-10-CM | POA: Diagnosis not present

## 2021-11-30 DIAGNOSIS — R011 Cardiac murmur, unspecified: Secondary | ICD-10-CM | POA: Diagnosis not present

## 2021-11-30 MED ORDER — PREDNISONE 50 MG PO TABS: 50.0000 mg | ORAL_TABLET | Freq: Every day | ORAL | 0 refills | Status: DC

## 2021-11-30 NOTE — Patient Instructions (Signed)
Preventive Care 80-53 Years Old, Female Preventive care refers to lifestyle choices and visits with your health care provider that can promote health and wellness. Preventive care visits are also called wellness exams. What can I expect for my preventive care visit? Counseling Your health care provider may ask you questions about your: Medical history, including: Past medical problems. Family medical history. Pregnancy history. Current health, including: Menstrual cycle. Method of birth control. Emotional well-being. Home life and relationship well-being. Sexual activity and sexual health. Lifestyle, including: Alcohol, nicotine or tobacco, and drug use. Access to firearms. Diet, exercise, and sleep habits. Work and work Statistician. Sunscreen use. Safety issues such as seatbelt and bike helmet use. Physical exam Your health care provider will check your: Height and weight. These may be used to calculate your BMI (body mass index). BMI is a measurement that tells if you are at a healthy weight. Waist circumference. This measures the distance around your waistline. This measurement also tells if you are at a healthy weight and may help predict your risk of certain diseases, such as type 2 diabetes and high blood pressure. Heart rate and blood pressure. Body temperature. Skin for abnormal spots. What immunizations do I need? Vaccines are usually given at various ages, according to a schedule. Your health care provider will recommend vaccines for you based on your age, medical history, and lifestyle or other factors, such as travel or where you work. What tests do I need? Screening Your health care provider may recommend screening tests for certain conditions. This may include: Lipid and cholesterol levels. Diabetes screening. This is done by checking your blood sugar (glucose) after you have not eaten for a while (fasting). Pelvic exam and Pap test. Hepatitis B test. Hepatitis C  test. HIV (human immunodeficiency virus) test. STI (sexually transmitted infection) testing, if you are at risk. Lung cancer screening. Colorectal cancer screening. Mammogram. Talk with your health care provider about when you should start having regular mammograms. This may depend on whether you have a family history of breast cancer. BRCA-related cancer screening. This may be done if you have a family history of breast, ovarian, tubal, or peritoneal cancers. Bone density scan. This is done to screen for osteoporosis. Talk with your health care provider about your test results, treatment options, and if necessary, the need for more tests. Follow these instructions at home: Eating and drinking  Eat a diet that includes fresh fruits and vegetables, whole grains, lean protein, and low-fat dairy products. Take vitamin and mineral supplements as recommended by your health care provider. Do not drink alcohol if: Your health care provider tells you not to drink. You are pregnant, may be pregnant, or are planning to become pregnant. If you drink alcohol: Limit how much you have to 0-1 drink a day. Know how much alcohol is in your drink. In the [place] one drink equals one 12 oz bottle of beer (355 mL), one 5 oz glass of wine (148 mL), or one 1 oz glass of hard liquor (44 mL). Lifestyle Brush your teeth every morning and night with fluoride toothpaste. Floss one time each day. Exercise for at least 30 minutes 5 or more days each week. Do not use any products that contain nicotine or tobacco. These products include cigarettes, chewing tobacco, and vaping devices, such as e-cigarettes. If you need help quitting, ask your health care provider. Do not use drugs. If you are sexually active, practice safe sex. Use a condom or other form of protection to prevent  STIs. °If you do not wish to become pregnant, use a form of birth control. If you plan to become pregnant, see your health care provider for a  prepregnancy visit. °Take aspirin only as told by your health care provider. Make sure that you understand how much to take and what form to take. Work with your health care provider to find out whether it is safe and beneficial for you to take aspirin daily. °Find healthy ways to manage stress, such as: °Meditation, yoga, or listening to music. °Journaling. °Talking to a trusted person. °Spending time with friends and family. °Minimize exposure to UV radiation to reduce your risk of skin cancer. °Safety °Always wear your seat belt while driving or riding in a vehicle. °Do not drive: °If you have been drinking alcohol. Do not ride with someone who has been drinking. °When you are tired or distracted. °While texting. °If you have been using any mind-altering substances or drugs. °Wear a helmet and other protective equipment during sports activities. °If you have firearms in your house, make sure you follow all gun safety procedures. °Seek help if you have been physically or sexually abused. °What's next? °Visit your health care provider once a year for an annual wellness visit. °Ask your health care provider how often you should have your eyes and teeth checked. °Stay up to date on all vaccines. °This information is not intended to replace advice given to you by your health care provider. Make sure you discuss any questions you have with your health care provider. °Document Revised: 04/08/2021 Document Reviewed: 04/08/2021 °[employer]vier Patient Education © 2022 [employer] ° °

## 2021-11-30 NOTE — Progress Notes (Signed)
HPI: Stephanie Bennett is a 51 y.o. female who  has a past medical history of Healthy adult on routine physical examination.  she presents to [facility] today, 11/30/21,  for chief complaint of: Annual physical exam  Dentist: UTD, upcoming appointment this week Eye exam: Never done, no correction, no concerns Exercise: None intentional Diet: Does not eat meat, cheese, or fish, does eat some eggs, mostly plant-based Pap smear: 2019, normal, due in 2024 Mammogram: 12/2020, normal Colon cancer screening: 12/2019, normal, due in 2023 COVID vaccine: UTD  Concerns: Right hand pain just proximal to the thumb, and the wrist area.  Has had swelling of the area that started yesterday.  Has been using heat/ice, bracing, and Epsom salt soaks.  Took ibuprofen last night with some relief.  Past medical, surgical, social and family history reviewed:  There are no problems to display for this patient.   Past Surgical History:  Procedure Laterality Date   TOOTH EXTRACTION     x2    Social History   Tobacco Use   Smoking status: Never   Smokeless tobacco: Never  Substance Use Topics   Alcohol use: Never    Family History  Problem Relation Age of Onset   Colon cancer Neg Hx    Colon polyps Neg Hx    Esophageal cancer Neg Hx    Rectal cancer Neg Hx    Stomach cancer Neg Hx      Current medication list and allergy/intolerance information reviewed:    Current Outpatient Medications  Medication Sig Dispense Refill   cholecalciferol (VITAMIN D3) 25 MCG (1000 UNIT) tablet Take 1,000 Units by mouth daily.     predniSONE (DELTASONE) 50 MG tablet Take 1 tablet (50 mg total) by mouth daily. 5 tablet 0   vitamin C (ASCORBIC ACID) 500 MG tablet Take by mouth.     vitamin C (ASCORBIC ACID) 500 MG tablet Take 1 tablet daily by mouth.     No current facility-administered medications for this visit.    No Known Allergies    Review of  Systems: Constitutional:  No  fever, no chills, No recent illness, No unintentional weight changes. No significant fatigue.  HEENT: No  headache, no vision change, no hearing change, No sore throat, No  sinus pressure Cardiac: No  chest pain, No  pressure, No palpitations, No  Orthopnea Respiratory:  No  shortness of breath. No  Cough Gastrointestinal: No  abdominal pain, No  nausea, No  vomiting,  No  blood in stool, No  diarrhea, No  constipation  Musculoskeletal: + Right wrist/hand pain as above Skin: No  Rash, No other wounds/concerning lesions Genitourinary: No  incontinence, No  abnormal genital bleeding, No abnormal genital discharge Hem/Onc: No  easy bruising/bleeding, No  abnormal lymph node Endocrine: No cold intolerance,  No heat intolerance. No polyuria/polydipsia/polyphagia  Neurologic: No  weakness, No  dizziness, No  slurred speech/focal weakness/facial droop Psychiatric: No  concerns with depression, No  concerns with anxiety, No sleep problems, No mood problems  Exam:  BP 110/73 (BP Location: Right Arm, Patient Position: Sitting, Cuff Size: Normal)    Pulse 75    Resp 20    Ht 5' 3"  (1.6 m)    Wt 180 lb 4.8 oz (81.8 kg)    LMP 02/01/2017 (Exact Date) Comment: Perimenopausal    SpO2 98%    BMI 31.94 kg/m  Constitutional: VS see above. General Appearance: alert, well-developed, well-nourished, NAD Eyes: Normal lids and conjunctive,  non-icteric sclera Ears, Nose, Mouth, Throat: MMM, Normal external inspection ears/nares/mouth/lips/gums. TM normal bilaterally.   Neck: No masses, trachea midline. No thyroid enlargement. No tenderness/mass appreciated. No lymphadenopathy Respiratory: Normal respiratory effort. no wheeze, no rhonchi, no rales Cardiovascular: S9/G2 normal, + systolic murmur, no rub/gallop auscultated. RRR. No lower extremity edema. Pedal pulse II/IV bilaterally DP and PT. No carotid bruit or JVD. No abdominal aortic bruit. Gastrointestinal: Nontender, no masses. No  hepatomegaly, no splenomegaly. No hernia appreciated. Bowel sounds normal. Rectal exam deferred.  Musculoskeletal: Gait normal. No clubbing/cyanosis of digits.  Neurological: Normal balance/coordination. No tremor. No cranial nerve deficit on limited exam. Motor and sensation intact and symmetric. Cerebellar reflexes intact.  Skin: warm, dry, intact. No rash/ulcer. No concerning nevi or subq nodules on limited exam.   Psychiatric: Normal judgment/insight. Normal mood and affect. Oriented x3.    ASSESSMENT/PLAN:   1. Annual physical exam Checking labs as below.  Wellness information provided with AVS.  Up-to-date on preventative care but would recommend evaluation by an optometrist/ophthalmologist - Lipid panel - COMPLETE METABOLIC PANEL WITH GFR - CBC with Differential/Platelet  2. Right hand pain Treating with prednisone 50 mg x 5 days.  Recommend continuing bracing, heat/ice, and other conservative measures.  Avoid ibuprofen while taking prednisone but okay to take 600-800 mg every 8 hours after completing the prednisone burst.  3. Systolic murmur Checking labs as below.  No record of EKG so updating that for baseline today.  EKG showing normal sinus rhythm, rate of 72 with normal axis.  Ordering echocardiogram.  No prior documentation of systolic murmur on file. - TSH - Lipid panel - COMPLETE METABOLIC PANEL WITH GFR - CBC with Differential/Platelet - ECHOCARDIOGRAM COMPLETE; Future - EKG 12-Lead  4. Diabetes mellitus screening Checking A1c. - Hemoglobin A1c  5. Thyroid disorder screen Checking TSH. - TSH  6. Encounter for screening mammogram for malignant neoplasm of breast Ordering mammogram today. - MM 3D SCREEN BREAST BILATERAL; Future  7. Encounter to establish care Reviewed available information and discussed care concerns with patient.    Orders Placed This Encounter  Procedures   MM 3D SCREEN BREAST BILATERAL   TSH   Lipid panel   COMPLETE METABOLIC PANEL  WITH GFR   CBC with Differential/Platelet   Hemoglobin A1c   EKG 12-Lead   ECHOCARDIOGRAM COMPLETE    Meds ordered this encounter  Medications   predniSONE (DELTASONE) 50 MG tablet    Sig: Take 1 tablet (50 mg total) by mouth daily.    Dispense:  5 tablet    Refill:  0    Order Specific Question:   Supervising Provider    Answer:   Luetta Nutting [[id number]]    Patient Instructions  Preventive Care 28-54 Years Old, Female Preventive care refers to lifestyle choices and visits with your health care provider that can promote health and wellness. Preventive care visits are also called wellness exams. What can I expect for my preventive care visit? Counseling Your health care provider may ask you questions about your: Medical history, including: Past medical problems. Family medical history. Pregnancy history. Current health, including: Menstrual cycle. Method of birth control. Emotional well-being. Home life and relationship well-being. Sexual activity and sexual health. Lifestyle, including: Alcohol, nicotine or tobacco, and drug use. Access to firearms. Diet, exercise, and sleep habits. Work and work Statistician. Sunscreen use. Safety issues such as seatbelt and bike helmet use. Physical exam Your health care provider will check your: Height and weight. These may be used to  calculate your BMI (body mass index). BMI is a measurement that tells if you are at a healthy weight. Waist circumference. This measures the distance around your waistline. This measurement also tells if you are at a healthy weight and may help predict your risk of certain diseases, such as type 2 diabetes and high blood pressure. Heart rate and blood pressure. Body temperature. Skin for abnormal spots. What immunizations do I need? Vaccines are usually given at various ages, according to a schedule. Your health care provider will recommend vaccines for you based on your age, medical history, and  lifestyle or other factors, such as travel or where you work. What tests do I need? Screening Your health care provider may recommend screening tests for certain conditions. This may include: Lipid and cholesterol levels. Diabetes screening. This is done by checking your blood sugar (glucose) after you have not eaten for a while (fasting). Pelvic exam and Pap test. Hepatitis B test. Hepatitis C test. HIV (human immunodeficiency virus) test. STI (sexually transmitted infection) testing, if you are at risk. Lung cancer screening. Colorectal cancer screening. Mammogram. Talk with your health care provider about when you should start having regular mammograms. This may depend on whether you have a family history of breast cancer. BRCA-related cancer screening. This may be done if you have a family history of breast, ovarian, tubal, or peritoneal cancers. Bone density scan. This is done to screen for osteoporosis. Talk with your health care provider about your test results, treatment options, and if necessary, the need for more tests. Follow these instructions at home: Eating and drinking  Eat a diet that includes fresh fruits and vegetables, whole grains, lean protein, and low-fat dairy products. Take vitamin and mineral supplements as recommended by your health care provider. Do not drink alcohol if: Your health care provider tells you not to drink. You are pregnant, may be pregnant, or are planning to become pregnant. If you drink alcohol: Limit how much you have to 0-1 drink a day. Know how much alcohol is in your drink. In the [place] one drink equals one 12 oz bottle of beer (355 mL), one 5 oz glass of wine (148 mL), or one 1 oz glass of hard liquor (44 mL). Lifestyle Brush your teeth every morning and night with fluoride toothpaste. Floss one time each day. Exercise for at least 30 minutes 5 or more days each week. Do not use any products that contain nicotine or tobacco. These  products include cigarettes, chewing tobacco, and vaping devices, such as e-cigarettes. If you need help quitting, ask your health care provider. Do not use drugs. If you are sexually active, practice safe sex. Use a condom or other form of protection to prevent STIs. If you do not wish to become pregnant, use a form of birth control. If you plan to become pregnant, see your health care provider for a prepregnancy visit. Take aspirin only as told by your health care provider. Make sure that you understand how much to take and what form to take. Work with your health care provider to find out whether it is safe and beneficial for you to take aspirin daily. Find healthy ways to manage stress, such as: Meditation, yoga, or listening to music. Journaling. Talking to a trusted person. Spending time with friends and family. Minimize exposure to UV radiation to reduce your risk of skin cancer. Safety Always wear your seat belt while driving or riding in a vehicle. Do not drive: If you have been  drinking alcohol. Do not ride with someone who has been drinking. When you are tired or distracted. While texting. If you have been using any mind-altering substances or drugs. Wear a helmet and other protective equipment during sports activities. If you have firearms in your house, make sure you follow all gun safety procedures. Seek help if you have been physically or sexually abused. What's next? Visit your health care provider once a year for an annual wellness visit. Ask your health care provider how often you should have your eyes and teeth checked. Stay up to date on all vaccines. This information is not intended to replace advice given to you by your health care provider. Make sure you discuss any questions you have with your health care provider. Document Revised: 04/08/2021 Document Reviewed: 04/08/2021 Elsevier Patient Education  [place].   Follow-up plan: Return in about 1 year  (around 11/30/2022) for annual physical exam or sooner if needed.  Clearnce Sorrel, DNP, APRN, FNP-BC [facility]

## 2021-12-01 DIAGNOSIS — Z131 Encounter for screening for diabetes mellitus: Secondary | ICD-10-CM | POA: Diagnosis not present

## 2021-12-01 DIAGNOSIS — R011 Cardiac murmur, unspecified: Secondary | ICD-10-CM | POA: Diagnosis not present

## 2021-12-01 DIAGNOSIS — Z0000 Encounter for general adult medical examination without abnormal findings: Secondary | ICD-10-CM | POA: Diagnosis not present

## 2021-12-01 DIAGNOSIS — Z1329 Encounter for screening for other suspected endocrine disorder: Secondary | ICD-10-CM | POA: Diagnosis not present

## 2021-12-02 ENCOUNTER — Encounter: Payer: Self-pay | Admitting: Medical-Surgical

## 2021-12-02 DIAGNOSIS — R7303 Prediabetes: Secondary | ICD-10-CM | POA: Insufficient documentation

## 2021-12-02 DIAGNOSIS — E786 Lipoprotein deficiency: Secondary | ICD-10-CM | POA: Insufficient documentation

## 2021-12-02 LAB — COMPLETE METABOLIC PANEL WITH GFR
AG Ratio: 1.5 (calc) (ref 1.0–2.5)
ALT: 20 U/L (ref 6–29)
AST: 17 U/L (ref 10–35)
Albumin: 4.1 g/dL (ref 3.6–5.1)
Alkaline phosphatase (APISO): 56 U/L (ref 37–153)
BUN: 15 mg/dL (ref 7–25)
CO2: 27 mmol/L (ref 20–32)
Calcium: 9.1 mg/dL (ref 8.6–10.4)
Chloride: 104 mmol/L (ref 98–110)
Creat: 0.94 mg/dL (ref 0.50–1.03)
Globulin: 2.8 g/dL (calc) (ref 1.9–3.7)
Glucose, Bld: 97 mg/dL (ref 65–99)
Potassium: 4.6 mmol/L (ref 3.5–5.3)
Sodium: 139 mmol/L (ref 135–146)
Total Bilirubin: 0.6 mg/dL (ref 0.2–1.2)
Total Protein: 6.9 g/dL (ref 6.1–8.1)
eGFR: 73 mL/min/{1.73_m2} (ref >=60)

## 2021-12-02 LAB — CBC WITH DIFFERENTIAL/PLATELET
Absolute Monocytes: 248 cells/uL (ref 200–950)
Basophils Absolute: 41 cells/uL (ref 0–200)
Basophils Relative: 1.1 %
Eosinophils Absolute: 78 cells/uL (ref 15–500)
Eosinophils Relative: 2.1 %
HCT: 40.8 % (ref 35.0–45.0)
Hemoglobin: 13.8 g/dL (ref 11.7–15.5)
Lymphs Abs: 1547 cells/uL (ref 850–3900)
MCH: 30.4 pg (ref 27.0–33.0)
MCHC: 33.8 g/dL (ref 32.0–36.0)
MCV: 89.9 fL (ref 80.0–100.0)
MPV: 11.6 fL (ref 7.5–12.5)
Monocytes Relative: 6.7 %
Neutro Abs: 1787 cells/uL (ref 1500–7800)
Neutrophils Relative %: 48.3 %
Platelets: 218 10*3/uL (ref 140–400)
RBC: 4.54 10*6/uL (ref 3.80–5.10)
RDW: 12.3 % (ref 11.0–15.0)
Total Lymphocyte: 41.8 %
WBC: 3.7 10*3/uL — ABNORMAL LOW (ref 3.8–10.8)

## 2021-12-02 LAB — HEMOGLOBIN A1C
Hgb A1c MFr Bld: 5.9 % of total Hgb — ABNORMAL HIGH (ref <5.7)
Mean Plasma Glucose: 123 mg/dL
eAG (mmol/L): 6.8 mmol/L

## 2021-12-02 LAB — LIPID PANEL
Cholesterol: 139 mg/dL (ref <200)
HDL: 35 mg/dL — ABNORMAL LOW (ref >=50)
LDL Cholesterol (Calc): 82 mg/dL (calc)
Non-HDL Cholesterol (Calc): 104 mg/dL (calc) (ref <130)
Total CHOL/HDL Ratio: 4 (calc) (ref <5.0)
Triglycerides: 122 mg/dL (ref <150)

## 2021-12-02 LAB — TSH: TSH: 2.33 mIU/L

## 2021-12-10 ENCOUNTER — Other Ambulatory Visit: Payer: Self-pay

## 2021-12-10 ENCOUNTER — Ambulatory Visit (HOSPITAL_BASED_OUTPATIENT_CLINIC_OR_DEPARTMENT_OTHER)
Admission: RE | Admit: 2021-12-10 | Discharge: 2021-12-10 | Disposition: A | Discharge status: Home / Self Care | Payer: 59 | Source: Ambulatory Visit | Attending: Medical-Surgical | Admitting: Medical-Surgical

## 2021-12-10 DIAGNOSIS — R011 Cardiac murmur, unspecified: Secondary | ICD-10-CM | POA: Diagnosis not present

## 2021-12-10 LAB — ECHOCARDIOGRAM COMPLETE
AR max vel: 1.68 cm2
AV Area VTI: 1.61 cm2
AV Area mean vel: 1.48 cm2
AV Mean grad: 5 mmHg
AV Peak grad: 8.2 mmHg
Ao pk vel: 1.43 m/s
Area-P 1/2: 3.21 cm2
S' Lateral: 2.8 cm

## 2021-12-10 NOTE — Progress Notes (Signed)
°  Echocardiogram 2D Echocardiogram has been performed.  Elmer Ramp 12/10/2021, 4:38 PM

## 2022-01-07 ENCOUNTER — Ambulatory Visit: Payer: 59

## 2022-01-28 ENCOUNTER — Ambulatory Visit: Payer: 59

## 2022-02-04 ENCOUNTER — Ambulatory Visit (INDEPENDENT_AMBULATORY_CARE_PROVIDER_SITE_OTHER): Payer: 59

## 2022-02-04 DIAGNOSIS — Z1231 Encounter for screening mammogram for malignant neoplasm of breast: Secondary | ICD-10-CM

## 2022-11-26 ENCOUNTER — Ambulatory Visit (INDEPENDENT_AMBULATORY_CARE_PROVIDER_SITE_OTHER): Payer: Commercial Managed Care - PPO

## 2022-11-26 ENCOUNTER — Ambulatory Visit
Admission: EM | Admit: 2022-11-26 | Discharge: 2022-11-26 | Disposition: A | Discharge status: Home / Self Care | Payer: Commercial Managed Care - PPO | Attending: Family Medicine | Admitting: Family Medicine

## 2022-11-26 DIAGNOSIS — R519 Headache, unspecified: Secondary | ICD-10-CM

## 2022-11-26 NOTE — Discharge Instructions (Addendum)
May take Ibuprofen 200mg , 4 tabs every 8 hours with food.  May take Tylenol as needed.  Rest for the next two days.

## 2022-11-26 NOTE — ED Triage Notes (Signed)
Pt c/o headache that started this morning. States it starts in back of her head and feels shooting pain in eyes. Light sensitivity. No hx of migraines. Pain 8/10 No OTC meds tried.

## 2022-11-26 NOTE — ED Provider Notes (Signed)
[facility]    CSN: [id number] Arrival date & time: 11/26/22  1244      History   Chief Complaint Chief Complaint  Patient presents with   Headache    HPI Stephanie Bennett is a 52 y.o. female.   Patient reports that she was awakened at 4am today with a severe right side headache that has persisted.  The headache started in her right occipital area before radiating to the right parietal area.  She feels a "shooting" pain behind her right eye.  She admits that this is the worst headache she ever had. She feels well otherwise without fever, nausea/vomiting, and other neurologic symptoms.  She denies history of migraines and family history of migraines.  The history is provided by the patient.    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      Home Medications    Prior to Admission medications   Medication Sig Start Date End Date Taking? Authorizing Provider  cholecalciferol (VITAMIN D3) 25 MCG (1000 UNIT) tablet Take 1,000 Units by mouth daily.    [provider]  predniSONE (DELTASONE) 50 MG tablet Take 1 tablet (50 mg total) by mouth daily. 11/30/21   Samuel Bouche, NP  vitamin C (ASCORBIC ACID) 500 MG tablet Take by mouth.    [provider]  vitamin C (ASCORBIC ACID) 500 MG tablet Take 1 tablet daily by mouth.    [provider]    Family History Family History  Problem Relation Age of Onset   Colon cancer Neg Hx    Colon polyps Neg Hx    Esophageal cancer Neg Hx    Rectal cancer Neg Hx    Stomach cancer Neg Hx     Social History Social History   Tobacco Use   Smoking status: Never   Smokeless tobacco: Never  Substance Use Topics    Alcohol use: Never   Drug use: Never     Allergies   Bactrim [sulfamethoxazole-trimethoprim]   Review of Systems Review of Systems  Constitutional:  Positive for activity change and appetite change. Negative for chills, diaphoresis, fatigue and fever.  HENT:  Negative for congestion, hearing loss and sinus pressure.   Eyes:  Positive for photophobia. Negative for visual disturbance.  Respiratory: Negative.    Cardiovascular: Negative.   Gastrointestinal: Negative.   Genitourinary: Negative.   Musculoskeletal: Negative.   Neurological:  Positive for headaches. Negative for dizziness, syncope, facial asymmetry, speech difficulty, weakness and light-headedness.     Physical Exam Triage Vital Signs ED Triage Vitals [11/26/22 1333]  Enc Vitals Group     BP 111/69     Pulse Rate (!) 55     Resp 16     Temp 98.1 F (36.7 C)     Temp Source Oral     SpO2 98 %     Weight      Height      Head Circumference      Peak Flow      Pain Score 8     Pain Loc      Pain Edu?      Excl.  in [place]?    No data found.  Updated Vital Signs BP 111/69 (BP Location: Right Arm)   Pulse (!) 55   Temp 98.1 F (36.7 C) (Oral)   Resp 16   LMP 01/29/2017 (Approximate)   SpO2 98%   Visual Acuity Right Eye Distance:   Left Eye Distance:   Bilateral Distance:    Right Eye Near:   Left Eye Near:    Bilateral Near:     Physical Exam Nursing notes and Vital Signs reviewed. Appearance:  Patient appears stated age, and in no acute distress Eyes:  Pupils are equal, round, and reactive to light and accomodation.  Extraocular movement is intact.  Conjunctivae are not inflamed.  Fundi normal.  Patient has mild photophobia.  Ears:  Canals normal.  Tympanic membranes normal.  Nose: Normal turbinates.  No sinus tenderness.  Pharynx:  Normal Neck:  Supple. No adenopathy. Lungs:  Clear to auscultation.  Breath sounds are equal.  Moving air well. Heart:  Regular rate and rhythm without murmurs,  rubs, or gallops.  Abdomen:  Nontender without masses or hepatosplenomegaly.  Bowel sounds are present.  No CVA or flank tenderness.  Extremities:  No edema.  Skin:  No rash present.  Neurologic:  Cranial nerves 2 through 12 are normal.  Patellar, achilles, and elbow reflexes are normal.  Cerebellar function is intact (finger-to-nose and rapid alternating hand movement).  Gait and station are normal.    UC Treatments / Results  Labs (all labs ordered are listed, but only abnormal results are displayed) Labs Reviewed - No data to display  EKG   Radiology CT Head Wo Contrast  Result Date: 11/26/2022 CLINICAL DATA:  Headache, new onset (Age >= 73y) EXAM: CT HEAD WITHOUT CONTRAST TECHNIQUE: Contiguous axial images were obtained from the base of the skull through the vertex without intravenous contrast. RADIATION DOSE REDUCTION: This exam was performed according to the departmental dose-optimization program which includes automated exposure control, adjustment of the mA and/or kV according to patient size and/or use of iterative reconstruction technique. COMPARISON:  None Available. FINDINGS: Brain: No evidence of acute infarction, hemorrhage, hydrocephalus, extra-axial collection or mass lesion/mass effect. Vascular: No hyperdense vessel. Skull: No acute fracture. Sinuses/Orbits: Clear sinuses.  No acute orbital findings. Other: No mastoid effusions. IMPRESSION: No evidence of acute intracranial abnormality. Electronically Signed   By: Margaretha Sheffield M.D.   On: 11/26/2022 15:24    Procedures Procedures (including critical care time)  Medications Ordered in UC Medications - No data to display  Initial Impression / Assessment and Plan / UC Course  I have reviewed the triage vital signs and the nursing notes.  Pertinent labs & imaging results that were available during my care of the patient were reviewed by me and considered in my medical decision making (see chart for details).     Negative CT head W/O contrast reassuring. Benign exam.  Headache may be first migraine.  Treat symptomatically for now. Patient has follow-up appointment with Samuel Bouche on 12/01/22.  Final Clinical Impressions(s) / UC Diagnoses   Final diagnoses:  Severe headache     Discharge Instructions      May take Ibuprofen 200mg , 4 tabs every 8 hours with food.  May take Tylenol as needed.  Rest for the next two days.    ED Prescriptions   None       Kandra Nicolas, MD 11/28/22 1549

## 2022-12-01 ENCOUNTER — Ambulatory Visit (INDEPENDENT_AMBULATORY_CARE_PROVIDER_SITE_OTHER): Payer: Commercial Managed Care - PPO | Admitting: Medical-Surgical

## 2022-12-01 ENCOUNTER — Encounter: Payer: Self-pay | Admitting: Medical-Surgical

## 2022-12-01 VITALS — BP 113/61 | HR 57 | Resp 20 | Ht 63.0 in | Wt 171.6 lb

## 2022-12-01 DIAGNOSIS — Z13220 Encounter for screening for lipoid disorders: Secondary | ICD-10-CM

## 2022-12-01 DIAGNOSIS — Z0000 Encounter for general adult medical examination without abnormal findings: Secondary | ICD-10-CM

## 2022-12-01 DIAGNOSIS — R7303 Prediabetes: Secondary | ICD-10-CM

## 2022-12-01 NOTE — Progress Notes (Signed)
Complete physical exam  Patient: Stephanie Bennett   DOB: [birth year]   52 y.o. Female  MRN: [id number]  Subjective:    Chief Complaint  Patient presents with   Annual Exam    Stephanie Bennett is a 52 y.o. female who presents today for a complete physical exam. She reports consuming a  mostly vegan  diet. Gym/ health club routine includes treadmill. She generally feels well. She reports sleeping fairly well. She does not have additional problems to discuss today.    Most recent fall risk assessment:    12/01/2022    9:08 AM  [place] in the past year? 0  Number falls in past yr: 0  Injury with Fall? 0  Risk for fall due to : No Fall Risks  Follow up Falls evaluation completed     Most recent depression screenings:    12/01/2022    9:08 AM 11/30/2021    9:25 AM  PHQ 2/9 Scores  PHQ - 2 Score 0 0    Vision:Not within last year  and Dental: No current dental problems and Receives regular dental care    Patient Care Team: Samuel Bouche, NP as PCP - General (Nurse Practitioner)   Outpatient Medications Prior to Visit  Medication Sig   cholecalciferol (VITAMIN D3) 25 MCG (1000 UNIT) tablet Take 1,000 Units by mouth daily.   vitamin C (ASCORBIC ACID) 500 MG tablet Take 1 tablet daily by mouth.   [DISCONTINUED] predniSONE (DELTASONE) 50 MG tablet Take 1 tablet (50 mg total) by mouth daily. (Patient not taking: Reported on 12/01/2022)   [DISCONTINUED] vitamin C (ASCORBIC ACID) 500 MG tablet Take by mouth.   No facility-administered medications prior to visit.    Review of Systems  Constitutional:  Negative for chills, fever, malaise/fatigue and weight loss.  HENT:  Negative for congestion, ear pain, hearing loss, sinus pain and sore throat.   Eyes:  Negative for blurred vision, photophobia and pain.  Respiratory:  Negative for cough, shortness of breath and wheezing.   Cardiovascular:  Negative for chest pain, palpitations and leg swelling.  Gastrointestinal:  Negative for  abdominal pain, constipation, diarrhea, heartburn, nausea and vomiting.  Genitourinary:  Negative for dysuria, frequency and urgency.  Musculoskeletal:  Negative for falls and neck pain.  Skin:  Negative for itching and rash.  Neurological:  Positive for headaches. Negative for dizziness and weakness.  Endo/Heme/Allergies:  Negative for polydipsia. Does not bruise/bleed easily.  Psychiatric/Behavioral:  Negative for depression, substance abuse and suicidal ideas. The patient is not nervous/anxious.      Objective:    BP 113/61   Pulse (!) 57   Resp 20   Ht 5\' 3"  (1.6 m)   Wt 171 lb 9.6 oz (77.8 kg)   LMP 01/29/2017 (Approximate)   SpO2 99%   BMI 30.40 kg/m    Physical Exam Constitutional:      General: She is not in acute distress.    Appearance: Normal appearance. She is not ill-appearing.  HENT:     Head: Normocephalic and atraumatic.     Right Ear: Tympanic membrane, ear canal and external ear normal. There is no impacted cerumen.     Left Ear: Tympanic membrane, ear canal and external ear normal. There is no impacted cerumen.     Nose: Nose normal. No congestion or rhinorrhea.     Mouth/Throat:     Mouth: Mucous membranes are moist.     Pharynx: No oropharyngeal exudate or posterior oropharyngeal  erythema.  Eyes:     General: No scleral icterus.       Right eye: No discharge.        Left eye: No discharge.     Extraocular Movements: Extraocular movements intact.     Conjunctiva/sclera: Conjunctivae normal.     Pupils: Pupils are equal, round, and reactive to light.  Neck:     Thyroid: No thyromegaly.     Vascular: No carotid bruit or JVD.     Trachea: Trachea normal.  Cardiovascular:     Rate and Rhythm: Normal rate and regular rhythm.     Pulses: Normal pulses.     Heart sounds: Normal heart sounds. No murmur heard.    No friction rub. No gallop.  Pulmonary:     Effort: Pulmonary effort is normal. No respiratory distress.     Breath sounds: Normal breath  sounds. No wheezing.  Abdominal:     General: Bowel sounds are normal. There is no distension.     Palpations: Abdomen is soft.     Tenderness: There is no abdominal tenderness. There is no guarding.  Musculoskeletal:        General: Normal range of motion.     Cervical back: Normal range of motion and neck supple.  Lymphadenopathy:     Cervical: No cervical adenopathy.  Skin:    General: Skin is warm and dry.  Neurological:     Mental Status: She is alert and oriented to person, place, and time.     Cranial Nerves: No cranial nerve deficit.  Psychiatric:        Mood and Affect: Mood normal.        Behavior: Behavior normal.        Thought Content: Thought content normal.        Judgment: Judgment normal.      No results found for any visits on 12/01/22.     Assessment & Plan:    Routine Health Maintenance and Physical Exam  Immunization History  Administered Date(s) Administered   Influenza-Unspecified 08/06/2017, 08/06/2018, 07/25/2021   PFIZER(Purple Top)SARS-COV-2 Vaccination 05/23/2020, 06/13/2020   Zoster Recombinat (Shingrix) 11/28/2019, 02/25/2020    Health Maintenance  Topic Date Due   DTaP/Tdap/Td (1 - Tdap) Never done   INFLUENZA VACCINE  05/25/2022   COVID-19 Vaccine (3 - 2023-24 season) 06/25/2022   MAMMOGRAM  02/05/2023   PAP SMEAR-Modifier  10/10/2023   COLONOSCOPY (Pts 45-65yrs Insurance coverage will need to be confirmed)  01/10/2030   HIV Screening  Completed   Zoster Vaccines- Shingrix  Completed   HPV VACCINES  Aged Out   Hepatitis C Screening  Discontinued    Discussed health benefits of physical activity, and encouraged her to engage in regular exercise appropriate for her age and condition.  1. Prediabetes Checking hemoglobin A1c. - Hemoglobin A1c  2. Annual physical exam Labs as below.  Up-to-date on dental care but would recommend updating eye exam soon.  Wellness information provided with AVS. - CBC with Differential/Platelet -  COMPLETE METABOLIC PANEL WITH GFR - Lipid panel  3. Lipid screening Checking lipids today. - Lipid panel   Return in about 1 year (around 12/02/2023) for annual physical exam or sooner if needed.   Samuel Bouche, NP

## 2022-12-02 LAB — LIPID PANEL
Cholesterol: 164 mg/dL (ref <200)
HDL: 39 mg/dL — ABNORMAL LOW (ref >=50)
LDL Cholesterol (Calc): 100 mg/dL (calc) — ABNORMAL HIGH
Non-HDL Cholesterol (Calc): 125 mg/dL (calc) (ref <130)
Total CHOL/HDL Ratio: 4.2 (calc) (ref <5.0)
Triglycerides: 148 mg/dL (ref <150)

## 2022-12-02 LAB — CBC WITH DIFFERENTIAL/PLATELET
Absolute Monocytes: 152 cells/uL — ABNORMAL LOW (ref 200–950)
Basophils Absolute: 40 cells/uL (ref 0–200)
Basophils Relative: 1 %
Eosinophils Absolute: 40 cells/uL (ref 15–500)
Eosinophils Relative: 1 %
HCT: 43.6 % (ref 35.0–45.0)
Hemoglobin: 14.9 g/dL (ref 11.7–15.5)
Lymphs Abs: 1700 cells/uL (ref 850–3900)
MCH: 31.2 pg (ref 27.0–33.0)
MCHC: 34.2 g/dL (ref 32.0–36.0)
MCV: 91.4 fL (ref 80.0–100.0)
MPV: 11.4 fL (ref 7.5–12.5)
Monocytes Relative: 3.8 %
Neutro Abs: 2068 cells/uL (ref 1500–7800)
Neutrophils Relative %: 51.7 %
Platelets: 227 10*3/uL (ref 140–400)
RBC: 4.77 10*6/uL (ref 3.80–5.10)
RDW: 12.3 % (ref 11.0–15.0)
Total Lymphocyte: 42.5 %
WBC: 4 10*3/uL (ref 3.8–10.8)

## 2022-12-02 LAB — HEMOGLOBIN A1C
Hgb A1c MFr Bld: 5.8 % of total Hgb — ABNORMAL HIGH (ref <5.7)
Mean Plasma Glucose: 120 mg/dL
eAG (mmol/L): 6.6 mmol/L

## 2022-12-02 LAB — COMPLETE METABOLIC PANEL WITH GFR
AG Ratio: 1.5 (calc) (ref 1.0–2.5)
ALT: 18 U/L (ref 6–29)
AST: 23 U/L (ref 10–35)
Albumin: 4.6 g/dL (ref 3.6–5.1)
Alkaline phosphatase (APISO): 62 U/L (ref 37–153)
BUN: 13 mg/dL (ref 7–25)
CO2: 31 mmol/L (ref 20–32)
Calcium: 9.9 mg/dL (ref 8.6–10.4)
Chloride: 103 mmol/L (ref 98–110)
Creat: 1.01 mg/dL (ref 0.50–1.03)
Globulin: 3.1 g/dL (calc) (ref 1.9–3.7)
Glucose, Bld: 92 mg/dL (ref 65–99)
Potassium: 4.7 mmol/L (ref 3.5–5.3)
Sodium: 141 mmol/L (ref 135–146)
Total Bilirubin: 1.1 mg/dL (ref 0.2–1.2)
Total Protein: 7.7 g/dL (ref 6.1–8.1)
eGFR: 67 mL/min/{1.73_m2} (ref >=60)

## 2023-02-03 ENCOUNTER — Encounter: Payer: Self-pay | Admitting: Medical-Surgical

## 2023-02-03 ENCOUNTER — Telehealth (INDEPENDENT_AMBULATORY_CARE_PROVIDER_SITE_OTHER): Payer: Commercial Managed Care - PPO | Admitting: Medical-Surgical

## 2023-02-03 DIAGNOSIS — B349 Viral infection, unspecified: Secondary | ICD-10-CM | POA: Diagnosis not present

## 2023-02-03 DIAGNOSIS — J040 Acute laryngitis: Secondary | ICD-10-CM | POA: Diagnosis not present

## 2023-02-03 LAB — POCT INFLUENZA A/B
Influenza A, POC: NEGATIVE
Influenza B, POC: NEGATIVE

## 2023-02-03 MED ORDER — METHYLPREDNISOLONE 4 MG PO TBPK: ORAL_TABLET | ORAL | 0 refills | Status: DC

## 2023-02-03 NOTE — Progress Notes (Signed)
Virtual Visit via Video Note  I connected with Stephanie Bennett on 02/03/23 at 11:10 AM EDT by a video enabled telemedicine application and verified that I am speaking with the correct person using two identifiers.   I discussed the limitations of evaluation and management by telemedicine and the availability of in person appointments. The patient expressed understanding and agreed to proceed.  Patient location: home Provider locations: office  Subjective:    CC: Viral symptoms  HPI: Pleasant 52 year old female presenting via MyChart video visit with reports of approximately 4 days of upper respiratory symptoms including sinus congestion, rhinorrhea, cough, and sore throat.  Symptoms originally started with an itchy throat and ears like allergies.  Yesterday, the coughing started.  Today, she reports that she has some body aches and generalized fatigue.  Is now losing her voice.  Has tried Zyrtec, zinc, vitamin C, vitamin D, and Tylenol with minimal relief of symptoms.  Potential exposure at work as she works at [employer].  Past medical history, Surgical history, Family history not pertinant except as noted below, Social history, Allergies, and medications have been entered into the medical record, reviewed, and corrections made.   Review of Systems: See HPI for pertinent positives and negatives.   Objective:    General: Speaking clearly in complete sentences without any shortness of breath.  Alert and oriented x3.  Normal judgment. No apparent acute distress.  Impression and Recommendations:    1. Viral illness 2. Laryngitis COVID-negative at home this morning.  Drive up POCT flu completed, negative results.  Suspect possible viral infection versus severe allergies.  Reviewed recommendations for symptomatic treatment.  Continue Zyrtec and over-the-counter measures as needed.  Will go ahead and treat with a Medrol Dosepak to help with laryngitis and potential allergic etiology.   Work note provided via Clinical cytogeneticist and paper copy provided to patient.  Advised to continue to monitor symptoms and if they fail to improve by Monday, please let me know. - POCT Influenza A/B  I discussed the assessment and treatment plan with the patient. The patient was provided an opportunity to ask questions and all were answered. The patient agreed with the plan and demonstrated an understanding of the instructions.   The patient was advised to call back or seek an in-person evaluation if the symptoms worsen or if the condition fails to improve as anticipated.  25 minutes of non-face-to-face time was provided during this encounter.  Return if symptoms worsen or fail to improve.  Thayer Ohm, DNP, APRN, FNP-BC [facility] and Sports Medicine

## 2023-02-04 ENCOUNTER — Telehealth: Payer: Self-pay

## 2023-02-04 NOTE — Telephone Encounter (Signed)
Retrieved vm from pt. She had a My Chart video visit on 02/03/2023. She says that refill for Prednisone was not sent into her pharmacy. I verified that the Medrol dosepak had been sent to [facility] at the time of her appointment. She states that she has  received a call from [facility] this morning, and rx is ready to be picked up.

## 2023-03-15 ENCOUNTER — Ambulatory Visit: Payer: Commercial Managed Care - PPO | Admitting: Medical-Surgical

## 2023-03-15 ENCOUNTER — Encounter: Payer: Self-pay | Admitting: Medical-Surgical

## 2023-03-15 VITALS — BP 97/62 | HR 70 | Resp 20 | Ht 63.0 in | Wt 175.4 lb

## 2023-03-15 DIAGNOSIS — H109 Unspecified conjunctivitis: Secondary | ICD-10-CM | POA: Diagnosis not present

## 2023-03-15 MED ORDER — POLYMYXIN B-TRIMETHOPRIM 10000-0.1 UNIT/ML-% OP SOLN: 1.0000 [drp] | Freq: Four times a day (QID) | OPHTHALMIC | 0 refills | Status: AC

## 2023-03-15 NOTE — Progress Notes (Signed)
        Established patient visit  History, exam, impression, and plan:  1. Bacterial conjunctivitis of left eye Very pleasant 52 year old female presenting today with complaints of left eye erythema, discharge, and discomfort.  Also has a dull headache just above her left eye that started yesterday.  This morning when she awoke she had crusty greenish-yellow drainage on her eyelashes of the left eye.  Approximately 2 weeks ago, she noted she was in the garden working and had manure splashed into her eye.  She did not take the time to rinse it as it was getting ready to rain and she needed to finish up her task.   On exam, EOM intact bilaterally.  Erythema noted to the left eye sclera on the lateral side.  No drainage currently.  No tenderness or swelling to the left temple. No right eye erythema or drainage noted.  Start Polytrim 1 drop to the left eye 4 times daily for 7 days.  If right eye becomes infected, okay to use Polytrim 1 drop to the right eye 4 times daily for 7 days as well.  Procedures performed this visit: None.  Return if symptoms worsen or fail to improve.  __________________________________ Thayer Ohm, DNP, APRN, FNP-BC Primary [facility] and Sports Medicine [facility] [place]

## 2023-03-16 ENCOUNTER — Telehealth: Payer: Self-pay | Admitting: Medical-Surgical

## 2023-03-16 NOTE — Telephone Encounter (Signed)
Pt called. She is requesting a note to return to work tomorrow.  She stayed out today because she was still having some eye drainage and redness.

## 2023-10-13 ENCOUNTER — Encounter: Payer: Self-pay | Admitting: Medical-Surgical

## 2023-10-13 ENCOUNTER — Ambulatory Visit: Payer: Commercial Managed Care - PPO | Admitting: Medical-Surgical

## 2023-10-13 VITALS — BP 109/65 | HR 75 | Temp 98.2°F | Resp 20 | Ht 63.0 in | Wt 179.6 lb

## 2023-10-13 DIAGNOSIS — B349 Viral infection, unspecified: Secondary | ICD-10-CM

## 2023-10-13 LAB — POC COVID19 BINAXNOW: SARS Coronavirus 2 Ag: NEGATIVE

## 2023-10-13 LAB — POCT RAPID STREP A (OFFICE): Rapid Strep A Screen: NEGATIVE

## 2023-10-13 LAB — POCT INFLUENZA A/B
Influenza A, POC: NEGATIVE
Influenza B, POC: NEGATIVE

## 2023-10-13 NOTE — Progress Notes (Signed)
        Established patient visit  History, exam, impression, and plan:  1. Viral illness (Primary) Pleasant 52 year old female presenting today with approximately 24 hours of upper respiratory symptoms including sore throat, body aches, chills, and sinus pressure.  Feels like her chest is burning a little.  Reports possible subjective fevers but did not take her temperature.  Reports that she just traveled to [place] last week.  Her husband is sick with similar symptoms.  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  Has tried taking Zicam and TheraFlu with only temporary benefit.  POCT flu, strep, and COVID all negative today.  Exam and presentation consistent with viral URI.  Recommend conservative treatment with home remedies and over-the-counter medications.  Offered prescription management but she will use over-the-counter for now.  If symptoms persist greater than 1 week or if she has an improvement followed by resickening, advised her to reach out via MyChart to let me know.  Work note provided with tentative return date of 10/17/2023. - POCT Influenza A/B - POCT rapid strep A - POC COVID-19   Procedures performed this visit: None.  Return if symptoms worsen or fail to improve.  __________________________________ Thayer Ohm, DNP, APRN, FNP-BC Primary Care and Sports Medicine [facility] [place]

## 2023-12-02 ENCOUNTER — Other Ambulatory Visit (HOSPITAL_COMMUNITY)
Admission: RE | Admit: 2023-12-02 | Discharge: 2023-12-02 | Disposition: A | Discharge status: Home / Self Care | Payer: 59 | Source: Ambulatory Visit | Attending: Medical-Surgical | Admitting: Medical-Surgical

## 2023-12-02 ENCOUNTER — Encounter: Payer: Self-pay | Admitting: Medical-Surgical

## 2023-12-02 ENCOUNTER — Ambulatory Visit (INDEPENDENT_AMBULATORY_CARE_PROVIDER_SITE_OTHER): Payer: 59 | Admitting: Medical-Surgical

## 2023-12-02 VITALS — BP 111/67 | HR 63 | Resp 20 | Ht 63.0 in | Wt 183.6 lb

## 2023-12-02 DIAGNOSIS — Z789 Other specified health status: Secondary | ICD-10-CM | POA: Insufficient documentation

## 2023-12-02 DIAGNOSIS — Z0000 Encounter for general adult medical examination without abnormal findings: Secondary | ICD-10-CM | POA: Diagnosis not present

## 2023-12-02 DIAGNOSIS — Z1231 Encounter for screening mammogram for malignant neoplasm of breast: Secondary | ICD-10-CM

## 2023-12-02 DIAGNOSIS — Z124 Encounter for screening for malignant neoplasm of cervix: Secondary | ICD-10-CM | POA: Diagnosis not present

## 2023-12-02 DIAGNOSIS — R7303 Prediabetes: Secondary | ICD-10-CM

## 2023-12-02 NOTE — Patient Instructions (Signed)
 Preventive Care 58-55 Years Old, Female  Preventive care refers to lifestyle choices and visits with your health care provider that can promote health and wellness. Preventive care visits are also called wellness exams.  What can I expect for my preventive care visit?  Counseling  Your health care provider may ask you questions about your:  Medical history, including:  Past medical problems.  Family medical history.  Pregnancy history.  Current health, including:  Menstrual cycle.  Method of birth control.  Emotional well-being.  Home life and relationship well-being.  Sexual activity and sexual health.  Lifestyle, including:  Alcohol, nicotine or tobacco, and drug use.  Access to firearms.  Diet, exercise, and sleep habits.  Work and work Astronomer.  Sunscreen use.  Safety issues such as seatbelt and bike helmet use.  Physical exam  Your health care provider will check your:  Height and weight. These may be used to calculate your BMI (body mass index). BMI is a measurement that tells if you are at a healthy weight.  Waist circumference. This measures the distance around your waistline. This measurement also tells if you are at a healthy weight and may help predict your risk of certain diseases, such as type 2 diabetes and high blood pressure.  Heart rate and blood pressure.  Body temperature.  Skin for abnormal spots.  What immunizations do I need?    Vaccines are usually given at various ages, according to a schedule. Your health care provider will recommend vaccines for you based on your age, medical history, and lifestyle or other factors, such as travel or where you work.  What tests do I need?  Screening  Your health care provider may recommend screening tests for certain conditions. This may include:  Lipid and cholesterol levels.  Diabetes screening. This is done by checking your blood sugar (glucose) after you have not eaten for a while (fasting).  Pelvic exam and Pap test.  Hepatitis B test.  Hepatitis C  test.  HIV (human immunodeficiency virus) test.  STI (sexually transmitted infection) testing, if you are at risk.  Lung cancer screening.  Colorectal cancer screening.  Mammogram. Talk with your health care provider about when you should start having regular mammograms. This may depend on whether you have a family history of breast cancer.  BRCA-related cancer screening. This may be done if you have a family history of breast, ovarian, tubal, or peritoneal cancers.  Bone density scan. This is done to screen for osteoporosis.  Talk with your health care provider about your test results, treatment options, and if necessary, the need for more tests.  Follow these instructions at home:  Eating and drinking    Eat a diet that includes fresh fruits and vegetables, whole grains, lean protein, and low-fat dairy products.  Take vitamin and mineral supplements as recommended by your health care provider.  Do not drink alcohol if:  Your health care provider tells you not to drink.  You are pregnant, may be pregnant, or are planning to become pregnant.  If you drink alcohol:  Limit how much you have to 0-1 drink a day.  Know how much alcohol is in your drink. In the [place] one drink equals one 12 oz bottle of beer (355 mL), one 5 oz glass of wine (148 mL), or one 1 oz glass of hard liquor (44 mL).  Lifestyle  Brush your teeth every morning and night with fluoride toothpaste. Floss one time each day.  Exercise for at least  30 minutes 5 or more days each week.  Do not use any products that contain nicotine or tobacco. These products include cigarettes, chewing tobacco, and vaping devices, such as e-cigarettes. If you need help quitting, ask your health care provider.  Do not use drugs.  If you are sexually active, practice safe sex. Use a condom or other form of protection to prevent STIs.  If you do not wish to become pregnant, use a form of birth control. If you plan to become pregnant, see your health care provider for a  prepregnancy visit.  Take aspirin only as told by your health care provider. Make sure that you understand how much to take and what form to take. Work with your health care provider to find out whether it is safe and beneficial for you to take aspirin daily.  Find healthy ways to manage stress, such as:  Meditation, yoga, or listening to music.  Journaling.  Talking to a trusted person.  Spending time with friends and family.  Minimize exposure to UV radiation to reduce your risk of skin cancer.  Safety  Always wear your seat belt while driving or riding in a vehicle.  Do not drive:  If you have been drinking alcohol. Do not ride with someone who has been drinking.  When you are tired or distracted.  While texting.  If you have been using any mind-altering substances or drugs.  Wear a helmet and other protective equipment during sports activities.  If you have firearms in your house, make sure you follow all gun safety procedures.  Seek help if you have been physically or sexually abused.  What's next?  Visit your health care provider once a year for an annual wellness visit.  Ask your health care provider how often you should have your eyes and teeth checked.  Stay up to date on all vaccines.  This information is not intended to replace advice given to you by your health care provider. Make sure you discuss any questions you have with your health care provider.  Document Revised: 04/08/2021 Document Reviewed: 04/08/2021  Elsevier Patient Education  2024 [employer].

## 2023-12-02 NOTE — Progress Notes (Signed)
 Complete physical exam  Patient: Stephanie Bennett   DOB: [birth year]   53 y.o. Female  MRN: [id number]  Subjective:    Chief Complaint  Patient presents with   Annual Exam   Gynecologic Exam    Stephanie Bennett is a 53 y.o. female who presents today for a complete physical exam. She reports consuming a  vegan  diet. The patient does not participate in regular exercise at present. She generally feels well. She reports sleeping well. She does not have additional problems to discuss today.    Most recent fall risk assessment:    03/15/2023   10:27 AM  Fall Risk   Falls in the past year? 0  Number falls in past yr: 0  Injury with Fall? 0  Risk for fall due to : No Fall Risks  Follow up Falls evaluation completed     Most recent depression screenings:    03/15/2023   10:27 AM 12/01/2022    9:08 AM  PHQ 2/9 Scores  PHQ - 2 Score 0 0    Vision:Within last year, Dental: No current dental problems and Receives regular dental care, and STD: The patient denies history of sexually transmitted disease.    Patient Care Team: Willo Mini, NP as PCP - General (Nurse Practitioner)   Outpatient Medications Prior to Visit  Medication Sig   cholecalciferol (VITAMIN D3) 25 MCG (1000 UNIT) tablet Take 1,000 Units by mouth daily.   doxycycline (VIBRAMYCIN) 100 MG capsule Take 100 mg by mouth daily.   vitamin C (ASCORBIC ACID) 500 MG tablet Take 1 tablet daily by mouth.   No facility-administered medications prior to visit.    Review of Systems  Constitutional:  Negative for chills, fever, malaise/fatigue and weight loss.  HENT:  Negative for congestion, ear pain, hearing loss, sinus pain and sore throat.   Eyes:  Negative for blurred vision, photophobia and pain.  Respiratory:  Negative for cough, shortness of breath and wheezing.   Cardiovascular:  Negative for chest pain, palpitations and leg swelling.  Gastrointestinal:  Negative for abdominal pain, constipation, diarrhea, heartburn,  nausea and vomiting.  Genitourinary:  Negative for dysuria, frequency and urgency.  Musculoskeletal:  Negative for falls and neck pain.  Skin:  Negative for itching and rash.  Neurological:  Negative for dizziness, weakness and headaches.  Endo/Heme/Allergies:  Negative for polydipsia. Does not bruise/bleed easily.  Psychiatric/Behavioral:  Negative for depression, substance abuse and suicidal ideas. The patient is not nervous/anxious and does not have insomnia.      Objective:    BP 111/67 (BP Location: Right Arm, Cuff Size: Normal)   Pulse 63   Resp 20   Ht 5' 3 (1.6 m)   Wt 183 lb 9.6 oz (83.3 kg)   LMP 01/29/2017 (Approximate)   SpO2 98%   BMI 32.52 kg/m    Physical Exam Vitals reviewed. Exam conducted with a chaperone present.  Constitutional:      General: She is not in acute distress.    Appearance: Normal appearance. She is not ill-appearing.  HENT:     Head: Normocephalic and atraumatic.     Right Ear: Tympanic membrane, ear canal and external ear normal. There is no impacted cerumen.     Left Ear: Tympanic membrane, ear canal and external ear normal. There is no impacted cerumen.     Nose: Nose normal. No congestion or rhinorrhea.     Mouth/Throat:     Mouth: Mucous membranes are moist.     Pharynx:  No oropharyngeal exudate or posterior oropharyngeal erythema.  Eyes:     General: No scleral icterus.       Right eye: No discharge.        Left eye: No discharge.     Extraocular Movements: Extraocular movements intact.     Conjunctiva/sclera: Conjunctivae normal.     Pupils: Pupils are equal, round, and reactive to light.  Neck:     Thyroid: No thyromegaly.     Vascular: No carotid bruit or JVD.     Trachea: Trachea normal.  Cardiovascular:     Rate and Rhythm: Normal rate and regular rhythm.     Pulses: Normal pulses.     Heart sounds: Normal heart sounds. No murmur heard.    No friction rub. No gallop.  Pulmonary:     Effort: Pulmonary effort is normal. No  respiratory distress.     Breath sounds: Normal breath sounds. No wheezing.  Abdominal:     General: Bowel sounds are normal. There is no distension.     Palpations: Abdomen is soft.     Tenderness: There is no abdominal tenderness. There is no guarding.  Genitourinary:    Exam position: Lithotomy position.     Pubic Area: No rash.      Labia:        Right: No rash, tenderness, lesion or injury.        Left: No rash, tenderness, lesion or injury.      Vagina: Normal.     Cervix: Normal.     Uterus: Normal.      Adnexa: Right adnexa normal and left adnexa normal.  Musculoskeletal:        General: Normal range of motion.     Cervical back: Normal range of motion and neck supple.  Lymphadenopathy:     Cervical: No cervical adenopathy.  Skin:    General: Skin is warm and dry.  Neurological:     Mental Status: She is alert and oriented to person, place, and time.     Cranial Nerves: No cranial nerve deficit.  Psychiatric:        Mood and Affect: Mood normal.        Behavior: Behavior normal.        Thought Content: Thought content normal.        Judgment: Judgment normal.      No results found for any visits on 12/02/23.     Assessment & Plan:    Routine Health Maintenance and Physical Exam  Immunization History  Administered Date(s) Administered   Influenza-Unspecified 08/06/2018, 07/25/2021, 08/24/2022, 08/25/2023   PFIZER(Purple Top)SARS-COV-2 Vaccination 05/23/2020, 06/13/2020   Zoster Recombinant(Shingrix) 11/28/2019, 02/25/2020    Health Maintenance  Topic Date Due   DTaP/Tdap/Td (1 - Tdap) Never done   MAMMOGRAM  02/05/2023   Cervical Cancer Screening (HPV/Pap Cotest)  10/10/2023   COVID-19 Vaccine (3 - 2024-25 season) 12/18/2023 (Originally 06/26/2023)   Colonoscopy  01/10/2030   INFLUENZA VACCINE  Completed   HIV Screening  Completed   Zoster Vaccines- Shingrix  Completed   HPV VACCINES  Aged Out   Hepatitis C Screening  Discontinued    Discussed health  benefits of physical activity, and encouraged her to engage in regular exercise appropriate for her age and condition.  1. Annual physical exam (Primary) Checking labs as below.  Up-to-date on preventative care.  Wellness information provided with AVS. - CBC with Differential/Platelet - CMP14+EGFR - Lipid panel  2. Cervical cancer screening Pap smear with  HPV cotesting completed today. - Cytology - PAP  3. Breast cancer screening by mammogram Mammogram ordered. - MM Digital Screening; Future  4. Prediabetes Checking hemoglobin A1c. - Hemoglobin A1c  5. Vegan diet Checking B12. - Vitamin B12   Return in about 1 year (around 12/01/2024) for annual physical exam or sooner if needed.   Anuoluwapo Mefferd, NP

## 2023-12-03 ENCOUNTER — Encounter: Payer: Self-pay | Admitting: Medical-Surgical

## 2023-12-03 LAB — CBC WITH DIFFERENTIAL/PLATELET
Basophils Absolute: 0.1 10*3/uL (ref 0.0–0.2)
Basos: 1 %
EOS (ABSOLUTE): 0.1 10*3/uL (ref 0.0–0.4)
Eos: 2 %
Hematocrit: 42.6 % (ref 34.0–46.6)
Hemoglobin: 14.1 g/dL (ref 11.1–15.9)
Immature Grans (Abs): 0 10*3/uL (ref 0.0–0.1)
Immature Granulocytes: 0 %
Lymphocytes Absolute: 1.9 10*3/uL (ref 0.7–3.1)
Lymphs: 50 %
MCH: 30.3 pg (ref 26.6–33.0)
MCHC: 33.1 g/dL (ref 31.5–35.7)
MCV: 91 fL (ref 79–97)
Monocytes Absolute: 0.2 10*3/uL (ref 0.1–0.9)
Monocytes: 6 %
Neutrophils Absolute: 1.6 10*3/uL (ref 1.4–7.0)
Neutrophils: 41 %
Platelets: 239 10*3/uL (ref 150–450)
RBC: 4.66 x10E6/uL (ref 3.77–5.28)
RDW: 12 % (ref 11.7–15.4)
WBC: 3.8 10*3/uL (ref 3.4–10.8)

## 2023-12-03 LAB — CMP14+EGFR
ALT: 20 [IU]/L (ref 0–32)
AST: 20 [IU]/L (ref 0–40)
Albumin: 4.3 g/dL (ref 3.8–4.9)
Alkaline Phosphatase: 69 [IU]/L (ref 44–121)
BUN/Creatinine Ratio: 10 (ref 9–23)
BUN: 9 mg/dL (ref 6–24)
Bilirubin Total: 1 mg/dL (ref 0.0–1.2)
CO2: 25 mmol/L (ref 20–29)
Calcium: 9.2 mg/dL (ref 8.7–10.2)
Chloride: 103 mmol/L (ref 96–106)
Creatinine, Ser: 0.89 mg/dL (ref 0.57–1.00)
Globulin, Total: 2.9 g/dL (ref 1.5–4.5)
Glucose: 92 mg/dL (ref 70–99)
Potassium: 4.1 mmol/L (ref 3.5–5.2)
Sodium: 141 mmol/L (ref 134–144)
Total Protein: 7.2 g/dL (ref 6.0–8.5)
eGFR: 77 mL/min/{1.73_m2} (ref >59)

## 2023-12-03 LAB — HEMOGLOBIN A1C
Est. average glucose Bld gHb Est-mCnc: 126 mg/dL
Hgb A1c MFr Bld: 6 % — ABNORMAL HIGH (ref 4.8–5.6)

## 2023-12-03 LAB — LIPID PANEL
Chol/HDL Ratio: 4.2 {ratio} (ref 0.0–4.4)
Cholesterol, Total: 140 mg/dL (ref 100–199)
HDL: 33 mg/dL — ABNORMAL LOW (ref >39)
LDL Chol Calc (NIH): 79 mg/dL (ref 0–99)
Triglycerides: 161 mg/dL — ABNORMAL HIGH (ref 0–149)
VLDL Cholesterol Cal: 28 mg/dL (ref 5–40)

## 2023-12-03 LAB — VITAMIN B12: Vitamin B-12: 330 pg/mL (ref 232–1245)

## 2023-12-07 ENCOUNTER — Encounter: Payer: Self-pay | Admitting: Medical-Surgical

## 2023-12-07 LAB — CYTOLOGY - PAP
Comment: NEGATIVE
Diagnosis: NEGATIVE
High risk HPV: NEGATIVE

## 2024-08-14 ENCOUNTER — Encounter: Payer: Self-pay | Admitting: Medical-Surgical

## 2024-12-04 ENCOUNTER — Encounter: Payer: 59 | Admitting: Medical-Surgical
